# Patient Record
Sex: Female | Born: 1950 | Hispanic: No | Marital: Married | Smoking: Never smoker
Health system: Southern US, Community
[De-identification: ages and names within clinical notes are randomized; demographics above are authoritative.]

## PROBLEM LIST (undated history)

## (undated) DIAGNOSIS — E039 Hypothyroidism, unspecified: Secondary | ICD-10-CM

## (undated) DIAGNOSIS — E7800 Pure hypercholesterolemia, unspecified: Secondary | ICD-10-CM

## (undated) DIAGNOSIS — T7840XA Allergy, unspecified, initial encounter: Secondary | ICD-10-CM

## (undated) DIAGNOSIS — G250 Essential tremor: Secondary | ICD-10-CM

## (undated) DIAGNOSIS — M1990 Unspecified osteoarthritis, unspecified site: Secondary | ICD-10-CM

## (undated) DIAGNOSIS — F32A Depression, unspecified: Secondary | ICD-10-CM

## (undated) DIAGNOSIS — F329 Major depressive disorder, single episode, unspecified: Secondary | ICD-10-CM

## (undated) DIAGNOSIS — J45909 Unspecified asthma, uncomplicated: Secondary | ICD-10-CM

## (undated) DIAGNOSIS — M5430 Sciatica, unspecified side: Secondary | ICD-10-CM

## (undated) DIAGNOSIS — R7303 Prediabetes: Secondary | ICD-10-CM

## (undated) DIAGNOSIS — Z86718 Personal history of other venous thrombosis and embolism: Secondary | ICD-10-CM

## (undated) DIAGNOSIS — F419 Anxiety disorder, unspecified: Secondary | ICD-10-CM

## (undated) DIAGNOSIS — L509 Urticaria, unspecified: Secondary | ICD-10-CM

## (undated) DIAGNOSIS — E079 Disorder of thyroid, unspecified: Secondary | ICD-10-CM

## (undated) HISTORY — PX: BREAST CYST ASPIRATION: SHX578

## (undated) HISTORY — DX: Pure hypercholesterolemia, unspecified: E78.00

## (undated) HISTORY — DX: Allergy, unspecified, initial encounter: T78.40XA

## (undated) HISTORY — DX: Depression, unspecified: F32.A

## (undated) HISTORY — PX: THYROIDECTOMY: SHX17

## (undated) HISTORY — PX: BREAST BIOPSY: SHX20

## (undated) HISTORY — DX: Prediabetes: R73.03

## (undated) HISTORY — DX: Urticaria, unspecified: L50.9

## (undated) HISTORY — PX: EYE SURGERY: SHX253

## (undated) HISTORY — DX: Hypothyroidism, unspecified: E03.9

## (undated) HISTORY — DX: Sciatica, unspecified side: M54.30

## (undated) HISTORY — DX: Unspecified osteoarthritis, unspecified site: M19.90

## (undated) HISTORY — DX: Essential tremor: G25.0

## (undated) HISTORY — DX: Disorder of thyroid, unspecified: E07.9

## (undated) HISTORY — DX: Personal history of other venous thrombosis and embolism: Z86.718

## (undated) HISTORY — DX: Unspecified asthma, uncomplicated: J45.909

## (undated) HISTORY — DX: Major depressive disorder, single episode, unspecified: F32.9

## (undated) HISTORY — DX: Anxiety disorder, unspecified: F41.9

## (undated) HISTORY — PX: BUNIONECTOMY: SHX129

---

## 2010-02-03 ENCOUNTER — Encounter
Admission: RE | Admit: 2010-02-03 | Discharge: 2010-02-03 | Payer: Self-pay | Source: Home / Self Care | Attending: Family Medicine | Admitting: Family Medicine

## 2012-02-18 ENCOUNTER — Other Ambulatory Visit: Payer: Self-pay | Admitting: Family Medicine

## 2012-02-18 DIAGNOSIS — Z1231 Encounter for screening mammogram for malignant neoplasm of breast: Secondary | ICD-10-CM

## 2012-03-08 ENCOUNTER — Ambulatory Visit (INDEPENDENT_AMBULATORY_CARE_PROVIDER_SITE_OTHER): Payer: BC Managed Care – PPO | Admitting: Emergency Medicine

## 2012-03-08 VITALS — BP 117/76 | HR 90 | Temp 97.7°F | Resp 16 | Ht 66.5 in | Wt 207.0 lb

## 2012-03-08 DIAGNOSIS — J209 Acute bronchitis, unspecified: Secondary | ICD-10-CM

## 2012-03-08 MED ORDER — AZITHROMYCIN 250 MG PO TABS: ORAL_TABLET | ORAL | Status: DC

## 2012-03-08 MED ORDER — HYDROCOD POLST-CHLORPHEN POLST 10-8 MG/5ML PO LQCR: 5.0000 mL | Freq: Two times a day (BID) | ORAL | Status: DC | PRN

## 2012-03-08 NOTE — Patient Instructions (Signed)

## 2012-03-08 NOTE — Progress Notes (Signed)
[facility] [address], [place] [address] [phone]  Date:  03/08/2012   Name:  Julia Gomez   DOB:  [birth year]   MRN:  [id number]  PCP:  No primary provider on file.    Chief Complaint: Cough and Sinus Problem   History of Present Illness:  Julia Gomez is a 62 y.o. very pleasant female patient who presents with the following:  Ill since Saturday with fever and cough.  Temp is low grade.  Cough is productive purulent sputum.  No wheezing or shortness of breath.  Has nasal congestion and discharge with a post nasal drainage.  Mucoid in character.  No nausea or vomiting.  Sore throat.  No improvement with OTC medication.  There is no problem list on file for this patient.   Past Medical History  Diagnosis Date  . Asthma   . Allergy   . Depression   . Thyroid disease     Past Surgical History  Procedure Laterality Date  . Eye surgery      History  Substance Use Topics  . Smoking status: Never Smoker   . Smokeless tobacco: Not on file  . Alcohol Use: 0.0 oz/week    0 drink(s) per week    Family History  Problem Relation Age of Onset  . Hyperlipidemia Mother   . Diverticulitis Mother   . Hypothyroidism Mother   . Aneurysm Father   . COPD Father   . Arthritis Maternal Grandmother   . Heart disease Maternal Grandfather   . Heart disease Paternal Grandfather     Allergies  Allergen Reactions  . Codeine Other (See Comments)    numbness    Medication list has been reviewed and updated.  No current outpatient prescriptions on file prior to visit.   No current facility-administered medications on file prior to visit.    Review of Systems:  As per HPI, otherwise negative.    Physical Examination: Filed Vitals:   03/08/12 1523  BP: 117/76  Pulse: 90  Temp: 97.7 F (36.5 C)  Resp: 16   Filed Vitals:   03/08/12 1523  Height: 5' 6.5" (1.689 m)  Weight: 207 lb (93.895 kg)   Body mass index is 32.91 kg/(m^2). Ideal  Body Weight: Weight in (lb) to have BMI = 25: 156.9  GEN: WDWN, NAD, Non-toxic, A & O x 3 HEENT: Atraumatic, Normocephalic. Neck supple. No masses, No LAD. Ears and Nose: No external deformity. CV: RRR, No M/G/R. No JVD. No thrill. No extra heart sounds. PULM: CTA B, no wheezes, crackles, rhonchi. No retractions. No resp. distress. No accessory muscle use. ABD: S, NT, ND, +BS. No rebound. No HSM. EXTR: No c/c/e NEURO Normal gait.  PSYCH: Normally interactive. Conversant. Not depressed or anxious appearing.  Calm demeanor.    Assessment and Plan: Bronchitis zithromax tussionex  Carmelina Dane, MD

## 2012-03-25 ENCOUNTER — Ambulatory Visit: Payer: Self-pay

## 2012-04-18 ENCOUNTER — Other Ambulatory Visit: Payer: Self-pay

## 2012-04-18 DIAGNOSIS — Z1231 Encounter for screening mammogram for malignant neoplasm of breast: Secondary | ICD-10-CM

## 2012-05-06 ENCOUNTER — Ambulatory Visit
Admission: RE | Admit: 2012-05-06 | Discharge: 2012-05-06 | Disposition: A | Discharge status: Home / Self Care | Payer: BC Managed Care – PPO | Source: Ambulatory Visit

## 2012-05-06 DIAGNOSIS — Z1231 Encounter for screening mammogram for malignant neoplasm of breast: Secondary | ICD-10-CM

## 2013-09-12 ENCOUNTER — Other Ambulatory Visit: Payer: Self-pay

## 2013-09-12 DIAGNOSIS — Z1231 Encounter for screening mammogram for malignant neoplasm of breast: Secondary | ICD-10-CM

## 2013-09-22 ENCOUNTER — Ambulatory Visit
Admission: RE | Admit: 2013-09-22 | Discharge: 2013-09-22 | Disposition: A | Discharge status: Home / Self Care | Payer: BC Managed Care – PPO | Source: Ambulatory Visit

## 2013-09-22 DIAGNOSIS — Z1231 Encounter for screening mammogram for malignant neoplasm of breast: Secondary | ICD-10-CM

## 2016-04-20 ENCOUNTER — Other Ambulatory Visit: Payer: Self-pay | Admitting: Family Medicine

## 2016-04-20 DIAGNOSIS — Z1231 Encounter for screening mammogram for malignant neoplasm of breast: Secondary | ICD-10-CM

## 2016-05-06 ENCOUNTER — Ambulatory Visit
Admission: RE | Admit: 2016-05-06 | Discharge: 2016-05-06 | Disposition: A | Discharge status: Home / Self Care | Payer: BLUE CROSS/BLUE SHIELD | Source: Ambulatory Visit | Attending: Family Medicine | Admitting: Family Medicine

## 2016-05-06 DIAGNOSIS — Z1231 Encounter for screening mammogram for malignant neoplasm of breast: Secondary | ICD-10-CM

## 2017-05-06 ENCOUNTER — Other Ambulatory Visit: Payer: Self-pay | Admitting: Family Medicine

## 2017-05-06 DIAGNOSIS — Z139 Encounter for screening, unspecified: Secondary | ICD-10-CM

## 2017-05-27 ENCOUNTER — Ambulatory Visit: Payer: BLUE CROSS/BLUE SHIELD

## 2017-06-08 ENCOUNTER — Encounter: Payer: Self-pay | Admitting: *Deleted

## 2017-07-26 ENCOUNTER — Ambulatory Visit
Admission: RE | Admit: 2017-07-26 | Discharge: 2017-07-26 | Disposition: A | Discharge status: Home / Self Care | Payer: Medicare HMO | Source: Ambulatory Visit | Attending: Family Medicine | Admitting: Family Medicine

## 2017-07-26 DIAGNOSIS — Z139 Encounter for screening, unspecified: Secondary | ICD-10-CM

## 2018-01-24 ENCOUNTER — Ambulatory Visit: Payer: Self-pay

## 2019-02-10 ENCOUNTER — Ambulatory Visit: Payer: Medicare HMO | Attending: Internal Medicine

## 2019-02-10 DIAGNOSIS — Z23 Encounter for immunization: Secondary | ICD-10-CM | POA: Insufficient documentation

## 2019-02-10 NOTE — Progress Notes (Signed)
   Covid-19 Vaccination Clinic  Name:  Julia Gomez    MRN: [id number] DOB: 07/20/1950  02/10/2019  [name] was observed post Covid-19 immunization for 15 minutes without incidence. She was provided with Vaccine Information Sheet and instruction to access the V-Safe system.   [name] was instructed to call 911 with any severe reactions post vaccine: Marland Kitchen Difficulty breathing  . Swelling of your face and throat  . A fast heartbeat  . A bad rash all over your body  . Dizziness and weakness    Immunizations Administered    Name Date Dose VIS Date Route   Pfizer COVID-19 Vaccine 02/10/2019  4:40 PM 0.3 mL 12/30/2018 Intramuscular   Manufacturer: [employer], [employer]   Lot: [id number]   NDC: [address]-0

## 2019-03-03 ENCOUNTER — Ambulatory Visit: Payer: Medicare HMO | Attending: Internal Medicine

## 2019-03-03 DIAGNOSIS — Z23 Encounter for immunization: Secondary | ICD-10-CM | POA: Insufficient documentation

## 2019-03-03 NOTE — Progress Notes (Signed)
   Covid-19 Vaccination Clinic  Name:  Julia Gomez    MRN: [id number] DOB: 05-Oct-1950  03/03/2019  [name] was observed post Covid-19 immunization for 15 minutes without incidence. She was provided with Vaccine Information Sheet and instruction to access the V-Safe system.   [name] was instructed to call 911 with any severe reactions post vaccine: Marland Kitchen Difficulty breathing  . Swelling of your face and throat  . A fast heartbeat  . A bad rash all over your body  . Dizziness and weakness    Immunizations Administered    Name Date Dose VIS Date Route   Pfizer COVID-19 Vaccine 03/03/2019  4:17 PM 0.3 mL 12/30/2018 Intramuscular   Manufacturer: [employer], [employer]   Lot: [id number]   NDC: [address]-4

## 2019-09-12 ENCOUNTER — Emergency Department (HOSPITAL_COMMUNITY): Payer: Medicare HMO

## 2019-09-12 ENCOUNTER — Other Ambulatory Visit: Payer: Self-pay

## 2019-09-12 ENCOUNTER — Inpatient Hospital Stay (HOSPITAL_COMMUNITY): Payer: Medicare HMO

## 2019-09-12 ENCOUNTER — Encounter (HOSPITAL_COMMUNITY): Payer: Self-pay

## 2019-09-12 ENCOUNTER — Inpatient Hospital Stay (HOSPITAL_COMMUNITY)
Admission: EM | Admit: 2019-09-12 | Discharge: 2019-09-19 | DRG: 166 | Disposition: A | Discharge status: Home Health | Payer: Medicare HMO | Attending: Internal Medicine | Admitting: Internal Medicine

## 2019-09-12 DIAGNOSIS — I2602 Saddle embolus of pulmonary artery with acute cor pulmonale: Secondary | ICD-10-CM | POA: Diagnosis not present

## 2019-09-12 DIAGNOSIS — R519 Headache, unspecified: Secondary | ICD-10-CM | POA: Diagnosis not present

## 2019-09-12 DIAGNOSIS — R32 Unspecified urinary incontinence: Secondary | ICD-10-CM | POA: Diagnosis present

## 2019-09-12 DIAGNOSIS — M7501 Adhesive capsulitis of right shoulder: Secondary | ICD-10-CM | POA: Diagnosis present

## 2019-09-12 DIAGNOSIS — R197 Diarrhea, unspecified: Secondary | ICD-10-CM | POA: Diagnosis present

## 2019-09-12 DIAGNOSIS — Z20822 Contact with and (suspected) exposure to covid-19: Secondary | ICD-10-CM | POA: Diagnosis present

## 2019-09-12 DIAGNOSIS — I82412 Acute embolism and thrombosis of left femoral vein: Secondary | ICD-10-CM | POA: Diagnosis not present

## 2019-09-12 DIAGNOSIS — Y92019 Unspecified place in single-family (private) house as the place of occurrence of the external cause: Secondary | ICD-10-CM

## 2019-09-12 DIAGNOSIS — I110 Hypertensive heart disease with heart failure: Secondary | ICD-10-CM | POA: Diagnosis present

## 2019-09-12 DIAGNOSIS — Z8261 Family history of arthritis: Secondary | ICD-10-CM

## 2019-09-12 DIAGNOSIS — Z885 Allergy status to narcotic agent status: Secondary | ICD-10-CM

## 2019-09-12 DIAGNOSIS — Y838 Other surgical procedures as the cause of abnormal reaction of the patient, or of later complication, without mention of misadventure at the time of the procedure: Secondary | ICD-10-CM | POA: Diagnosis not present

## 2019-09-12 DIAGNOSIS — Z6836 Body mass index (BMI) 36.0-36.9, adult: Secondary | ICD-10-CM | POA: Diagnosis not present

## 2019-09-12 DIAGNOSIS — I2721 Secondary pulmonary arterial hypertension: Secondary | ICD-10-CM | POA: Diagnosis present

## 2019-09-12 DIAGNOSIS — J9601 Acute respiratory failure with hypoxia: Secondary | ICD-10-CM | POA: Diagnosis present

## 2019-09-12 DIAGNOSIS — M542 Cervicalgia: Secondary | ICD-10-CM | POA: Diagnosis present

## 2019-09-12 DIAGNOSIS — Z79899 Other long term (current) drug therapy: Secondary | ICD-10-CM

## 2019-09-12 DIAGNOSIS — E785 Hyperlipidemia, unspecified: Secondary | ICD-10-CM | POA: Diagnosis present

## 2019-09-12 DIAGNOSIS — W010XXA Fall on same level from slipping, tripping and stumbling without subsequent striking against object, initial encounter: Secondary | ICD-10-CM | POA: Diagnosis present

## 2019-09-12 DIAGNOSIS — D62 Acute posthemorrhagic anemia: Secondary | ICD-10-CM | POA: Diagnosis not present

## 2019-09-12 DIAGNOSIS — E669 Obesity, unspecified: Secondary | ICD-10-CM | POA: Diagnosis present

## 2019-09-12 DIAGNOSIS — Z8249 Family history of ischemic heart disease and other diseases of the circulatory system: Secondary | ICD-10-CM

## 2019-09-12 DIAGNOSIS — Z87820 Personal history of traumatic brain injury: Secondary | ICD-10-CM

## 2019-09-12 DIAGNOSIS — Z83438 Family history of other disorder of lipoprotein metabolism and other lipidemia: Secondary | ICD-10-CM

## 2019-09-12 DIAGNOSIS — N179 Acute kidney failure, unspecified: Secondary | ICD-10-CM | POA: Diagnosis present

## 2019-09-12 DIAGNOSIS — I9762 Postprocedural hemorrhage of a circulatory system organ or structure following other procedure: Secondary | ICD-10-CM | POA: Diagnosis not present

## 2019-09-12 DIAGNOSIS — J45909 Unspecified asthma, uncomplicated: Secondary | ICD-10-CM | POA: Diagnosis present

## 2019-09-12 DIAGNOSIS — R7303 Prediabetes: Secondary | ICD-10-CM | POA: Diagnosis present

## 2019-09-12 DIAGNOSIS — M19011 Primary osteoarthritis, right shoulder: Secondary | ICD-10-CM | POA: Diagnosis present

## 2019-09-12 DIAGNOSIS — I82452 Acute embolism and thrombosis of left peroneal vein: Secondary | ICD-10-CM | POA: Diagnosis present

## 2019-09-12 DIAGNOSIS — S0003XA Contusion of scalp, initial encounter: Secondary | ICD-10-CM | POA: Diagnosis present

## 2019-09-12 DIAGNOSIS — I2699 Other pulmonary embolism without acute cor pulmonale: Secondary | ICD-10-CM | POA: Diagnosis present

## 2019-09-12 DIAGNOSIS — I50810 Right heart failure, unspecified: Secondary | ICD-10-CM | POA: Diagnosis present

## 2019-09-12 DIAGNOSIS — R52 Pain, unspecified: Secondary | ICD-10-CM | POA: Diagnosis not present

## 2019-09-12 DIAGNOSIS — F329 Major depressive disorder, single episode, unspecified: Secondary | ICD-10-CM | POA: Diagnosis present

## 2019-09-12 DIAGNOSIS — R34 Anuria and oliguria: Secondary | ICD-10-CM | POA: Diagnosis not present

## 2019-09-12 DIAGNOSIS — Z825 Family history of asthma and other chronic lower respiratory diseases: Secondary | ICD-10-CM

## 2019-09-12 DIAGNOSIS — G250 Essential tremor: Secondary | ICD-10-CM | POA: Diagnosis present

## 2019-09-12 DIAGNOSIS — Z86711 Personal history of pulmonary embolism: Secondary | ICD-10-CM | POA: Diagnosis not present

## 2019-09-12 DIAGNOSIS — I2692 Saddle embolus of pulmonary artery without acute cor pulmonale: Secondary | ICD-10-CM | POA: Diagnosis present

## 2019-09-12 DIAGNOSIS — I82432 Acute embolism and thrombosis of left popliteal vein: Secondary | ICD-10-CM | POA: Diagnosis present

## 2019-09-12 DIAGNOSIS — I82442 Acute embolism and thrombosis of left tibial vein: Secondary | ICD-10-CM | POA: Diagnosis present

## 2019-09-12 DIAGNOSIS — Z8349 Family history of other endocrine, nutritional and metabolic diseases: Secondary | ICD-10-CM

## 2019-09-12 HISTORY — PX: IR INFUSION THROMBOL ARTERIAL INITIAL (MS): IMG5376

## 2019-09-12 HISTORY — PX: IR ANGIOGRAM PULMONARY BILATERAL SELECTIVE: IMG664

## 2019-09-12 HISTORY — PX: IR US GUIDE VASC ACCESS RIGHT: IMG2390

## 2019-09-12 HISTORY — PX: IR ANGIOGRAM SELECTIVE EACH ADDITIONAL VESSEL: IMG667

## 2019-09-12 LAB — CBC
HCT: 43 % (ref 36.0–46.0)
HCT: 38.5 % (ref 36.0–46.0)
Hemoglobin: 13.3 g/dL (ref 12.0–15.0)
Hemoglobin: 12.1 g/dL (ref 12.0–15.0)
MCH: 29.8 pg (ref 26.0–34.0)
MCH: 29.4 pg (ref 26.0–34.0)
MCHC: 30.9 g/dL (ref 30.0–36.0)
MCHC: 31.4 g/dL (ref 30.0–36.0)
MCV: 96.2 fL (ref 80.0–100.0)
MCV: 93.4 fL (ref 80.0–100.0)
Platelets: 209 10*3/uL (ref 150–400)
Platelets: 144 10*3/uL — ABNORMAL LOW (ref 150–400)
RBC: 4.47 MIL/uL (ref 3.87–5.11)
RBC: 4.12 MIL/uL (ref 3.87–5.11)
RDW: 15.5 % (ref 11.5–15.5)
RDW: 15.4 % (ref 11.5–15.5)
WBC: 10.1 10*3/uL (ref 4.0–10.5)
WBC: 9.6 10*3/uL (ref 4.0–10.5)
nRBC: 0 % (ref 0.0–0.2)
nRBC: 0 % (ref 0.0–0.2)

## 2019-09-12 LAB — FERRITIN: Ferritin: 270 ng/mL (ref 11–307)

## 2019-09-12 LAB — FIBRINOGEN
Fibrinogen: 338 mg/dL (ref 210–475)
Fibrinogen: 663 mg/dL — ABNORMAL HIGH (ref 210–475)

## 2019-09-12 LAB — HEMOGLOBIN A1C
Hgb A1c MFr Bld: 6.1 % — ABNORMAL HIGH (ref 4.8–5.6)
Mean Plasma Glucose: 128.37 mg/dL

## 2019-09-12 LAB — BASIC METABOLIC PANEL
Anion gap: 11 (ref 5–15)
BUN: 15 mg/dL (ref 8–23)
CO2: 22 mmol/L (ref 22–32)
Calcium: 8.1 mg/dL — ABNORMAL LOW (ref 8.9–10.3)
Chloride: 108 mmol/L (ref 98–111)
Creatinine, Ser: 1.34 mg/dL — ABNORMAL HIGH (ref 0.44–1.00)
GFR calc Af Amer: 47 mL/min — ABNORMAL LOW (ref >60)
GFR calc non Af Amer: 41 mL/min — ABNORMAL LOW (ref >60)
Glucose, Bld: 147 mg/dL — ABNORMAL HIGH (ref 70–99)
Potassium: 4.4 mmol/L (ref 3.5–5.1)
Sodium: 141 mmol/L (ref 135–145)

## 2019-09-12 LAB — PROTIME-INR
INR: 1.1 (ref 0.8–1.2)
Prothrombin Time: 13.7 seconds (ref 11.4–15.2)

## 2019-09-12 LAB — GLUCOSE, CAPILLARY: Glucose-Capillary: 97 mg/dL (ref 70–99)

## 2019-09-12 LAB — C-REACTIVE PROTEIN: CRP: 9.9 mg/dL — ABNORMAL HIGH (ref <1.0)

## 2019-09-12 LAB — D-DIMER, QUANTITATIVE: D-Dimer, Quant: 14.2 ug/mL-FEU — ABNORMAL HIGH (ref 0.00–0.50)

## 2019-09-12 LAB — TRIGLYCERIDES: Triglycerides: 112 mg/dL (ref <150)

## 2019-09-12 LAB — LACTIC ACID, PLASMA: Lactic Acid, Venous: 1.8 mmol/L (ref 0.5–1.9)

## 2019-09-12 LAB — SARS CORONAVIRUS 2 BY RT PCR (HOSPITAL ORDER, PERFORMED IN ~~LOC~~ HOSPITAL LAB): SARS Coronavirus 2: NEGATIVE

## 2019-09-12 LAB — APTT: aPTT: 27 seconds (ref 24–36)

## 2019-09-12 LAB — LACTATE DEHYDROGENASE: LDH: 376 U/L — ABNORMAL HIGH (ref 98–192)

## 2019-09-12 LAB — PROCALCITONIN: Procalcitonin: <0.1 ng/mL

## 2019-09-12 MED ORDER — MIDAZOLAM HCL 2 MG/2ML IJ SOLN
INTRAMUSCULAR | Status: AC
  Filled 2019-09-12: qty 2

## 2019-09-12 MED ORDER — LIDOCAINE HCL 1 % IJ SOLN
INTRAMUSCULAR | Status: AC
  Filled 2019-09-12: qty 20

## 2019-09-12 MED ORDER — DOCUSATE SODIUM 100 MG PO CAPS
100.0000 mg | ORAL_CAPSULE | Freq: Every day | ORAL | Status: DC
  Administered 2019-09-15: 100 mg via ORAL
  Filled 2019-09-12: qty 1

## 2019-09-12 MED ORDER — CHLORHEXIDINE GLUCONATE CLOTH 2 % EX PADS
6.0000 | MEDICATED_PAD | Freq: Every day | CUTANEOUS | Status: DC
  Administered 2019-09-12: 6 via TOPICAL

## 2019-09-12 MED ORDER — IOHEXOL 300 MG/ML  SOLN
100.0000 mL | Freq: Once | INTRAMUSCULAR | Status: AC | PRN
  Administered 2019-09-12: 25 mL via INTRAVENOUS

## 2019-09-12 MED ORDER — POLYETHYLENE GLYCOL 3350 17 G PO PACK: 17.0000 g | PACK | Freq: Every day | ORAL | Status: DC | PRN

## 2019-09-12 MED ORDER — FENTANYL CITRATE (PF) 100 MCG/2ML IJ SOLN
INTRAMUSCULAR | Status: AC
  Filled 2019-09-12: qty 2

## 2019-09-12 MED ORDER — ONDANSETRON HCL 4 MG/2ML IJ SOLN: 4.0000 mg | Freq: Four times a day (QID) | INTRAMUSCULAR | Status: DC | PRN

## 2019-09-12 MED ORDER — POLYETHYLENE GLYCOL 3350 17 G PO PACK
17.0000 g | PACK | Freq: Every day | ORAL | Status: DC
  Administered 2019-09-15 – 2019-09-18 (×3): 17 g via ORAL
  Filled 2019-09-12 (×5): qty 1

## 2019-09-12 MED ORDER — FENTANYL CITRATE (PF) 100 MCG/2ML IJ SOLN
INTRAMUSCULAR | Status: AC | PRN
  Administered 2019-09-12 (×2): 50 ug via INTRAVENOUS

## 2019-09-12 MED ORDER — SODIUM CHLORIDE 0.9 % IV SOLN: INTRAVENOUS | Status: DC

## 2019-09-12 MED ORDER — HEPARIN BOLUS VIA INFUSION
5500.0000 [IU] | Freq: Once | INTRAVENOUS | Status: AC
  Administered 2019-09-12: 5500 [IU] via INTRAVENOUS
  Filled 2019-09-12: qty 5500

## 2019-09-12 MED ORDER — MIDAZOLAM HCL 2 MG/2ML IJ SOLN
INTRAMUSCULAR | Status: AC | PRN
  Administered 2019-09-12 (×2): 0.5 mg via INTRAVENOUS

## 2019-09-12 MED ORDER — SODIUM CHLORIDE 0.9 % IV SOLN: 250.0000 mL | INTRAVENOUS | Status: DC | PRN

## 2019-09-12 MED ORDER — SODIUM CHLORIDE 0.9% FLUSH
3.0000 mL | Freq: Two times a day (BID) | INTRAVENOUS | Status: DC
  Administered 2019-09-13 – 2019-09-18 (×7): 3 mL via INTRAVENOUS

## 2019-09-12 MED ORDER — SODIUM CHLORIDE 0.9 % IV BOLUS
500.0000 mL | Freq: Once | INTRAVENOUS | Status: AC
  Administered 2019-09-12: 500 mL via INTRAVENOUS

## 2019-09-12 MED ORDER — DOCUSATE SODIUM 100 MG PO CAPS: 100.0000 mg | ORAL_CAPSULE | Freq: Two times a day (BID) | ORAL | Status: DC | PRN

## 2019-09-12 MED ORDER — HEPARIN (PORCINE) 25000 UT/250ML-% IV SOLN
1100.0000 [IU]/h | INTRAVENOUS | Status: DC
  Administered 2019-09-12: 1400 [IU]/h via INTRAVENOUS
  Administered 2019-09-13: 1200 [IU]/h via INTRAVENOUS
  Administered 2019-09-14: 1000 [IU]/h via INTRAVENOUS
  Administered 2019-09-16: 1150 [IU]/h via INTRAVENOUS
  Filled 2019-09-12 (×5): qty 250

## 2019-09-12 MED ORDER — IOHEXOL 350 MG/ML SOLN
60.0000 mL | Freq: Once | INTRAVENOUS | Status: AC | PRN
  Administered 2019-09-12: 60 mL via INTRAVENOUS

## 2019-09-12 MED ORDER — SODIUM CHLORIDE 0.9 % IV SOLN
12.0000 mg | Freq: Once | INTRAVENOUS | Status: AC
  Administered 2019-09-12: 12 mg via INTRAVENOUS
  Filled 2019-09-12: qty 12

## 2019-09-12 MED ORDER — SODIUM CHLORIDE 0.9% FLUSH: 3.0000 mL | INTRAVENOUS | Status: DC | PRN

## 2019-09-12 MED ORDER — ORAL CARE MOUTH RINSE
15.0000 mL | Freq: Two times a day (BID) | OROMUCOSAL | Status: DC
  Administered 2019-09-13 – 2019-09-18 (×8): 15 mL via OROMUCOSAL

## 2019-09-12 MED ORDER — LIDOCAINE HCL (PF) 1 % IJ SOLN
INTRAMUSCULAR | Status: AC | PRN
  Administered 2019-09-12 (×2): 5 mL

## 2019-09-12 MED ORDER — ACETAMINOPHEN 325 MG PO TABS
650.0000 mg | ORAL_TABLET | Freq: Once | ORAL | Status: AC
  Administered 2019-09-13: 650 mg via ORAL
  Filled 2019-09-12 (×2): qty 2

## 2019-09-12 NOTE — Procedures (Signed)
Pre procedural Dx: Submassive PE Post procedural Dx: Same  Successful initiation of bilateral catheter directed pulmonary arterial thrombolysis.  Main PA pressure: 61/32 (mean - 44)  Access: R CFV x2 Keep right leg straight while sheaths remain in place.     EBL: Trace Complications: None immediate  PLAN: AM CXR and bedside pressure measurement and catheter removal tomorrow after 12 hrs of lytic infusion.   Katherina Right, MD Pager #: [phone]

## 2019-09-12 NOTE — ED Triage Notes (Signed)
Pt BIB [facility] for eval of fall x 1 week ago. Since that time pt has developed increased weakness, dizziness, SOB, general malaise. EMS also reports head/neck pain, no thinners. Pt reports she tripped and fell, unsure of LOC>

## 2019-09-12 NOTE — Progress Notes (Signed)
ANTICOAGULATION CONSULT NOTE - Initial Consult  Pharmacy Consult for heparin Indication: pulmonary embolus  Allergies  Allergen Reactions   Codeine Other (See Comments)    numbness    Patient Measurements: Height: 5' 6.5" (168.9 cm) Weight: 94 kg (207 lb 3.7 oz) IBW/kg (Calculated) : 60.45 Heparin Dosing Weight: 81.1 kg  Vital Signs: Temp: 100.7 F (38.2 C) (08/24 1335) Temp Source: Rectal (08/24 1335) BP: 99/85 (08/24 1600) Pulse Rate: 97 (08/24 1600)  Labs: Recent Labs    09/12/19 1256  HGB 13.3  HCT 43.0  PLT 209  CREATININE 1.34*    Estimated Creatinine Clearance: 47.5 mL/min (A) (by C-G formula based on SCr of 1.34 mg/dL (H)).   Medical History: Past Medical History:  Diagnosis Date   Allergy    Asthma    Depression    Thyroid disease     Medications:  Scheduled:   acetaminophen  650 mg Oral Once   heparin  5,500 Units Intravenous Once   Infusions:   heparin      Assessment: 69 yo female presents s/p mechanical fall on 09/07/19 due to fatigue and shortness of breath. The patient did hit their head but did not lose consciousness. Upon arrival, the patient was hypoxic at 78% O2 on room air. D-dimer 14.20, CBC WNL, LDH 376 and CRP 9.9. The patient is COVID negative. Pharmacy is consulted to dose heparin.  The patient is not on any anticoagulation PTA. CT is positive for pulmonary embolism. The patient has a CBC WNL and there are no signs or symptoms of bleeding documented.   Goal of Therapy:  Heparin level 0.3-0.7 units/ml Monitor platelets by anticoagulation protocol: Yes   Plan:  Heparin IV 5500 unit bolus x1 dose Heparin IV infusion at 1400 units/hour Obtain 6-hour heparin level Monitor daily heparin level and CBC Monitor for signs and symptoms of bleeding  Sanda Klein, PharmD, RPh  PGY-1 Pharmacy Resident 09/12/2019 4:56 PM  Please check [email or web address] for unit-specific pharmacy phone numbers.

## 2019-09-12 NOTE — ED Provider Notes (Signed)
[facility] EMERGENCY DEPARTMENT Provider Note   CSN: [id number] Arrival date & time: 09/12/19  1251     History Chief Complaint  Patient presents with  . Fall  . Dizziness    Julia Gomez is a 69 y.o. female with PMHx HTN, depression, who presents to the ED via EMS for mechanical fall that occurred 6 days ago on 08/19. Pt reports she believes she tripped and fell, hitting her head. No LOC. She states she saw her PCP at that time and was diagnosed with a concussion. She states that since that day (prior to the fall) she has been very fatigued and felt short of breath. Pt noted to be hypoxic on arrival with O2 sats at 78% on RA. Pt is not normally on oxygen at home. She denies any recent sick contacts. She has been vaccinated against COVID 19. Denies fevers, chills, cough, chest pain, abdominal pain, nausea, vomiting, diarrhea, or any other associated symptoms. Pt does report she has had a headache and neck pain since her fall as well as dizziness. No blurry vision or double vision.   The history is provided by the patient, medical records and the EMS personnel.       Past Medical History:  Diagnosis Date  . Allergy   . Asthma   . Depression   . Thyroid disease     There are no problems to display for this patient.   Past Surgical History:  Procedure Laterality Date  . BREAST BIOPSY Right    benign  . BREAST CYST ASPIRATION Left    benign  . EYE SURGERY       OB History   No obstetric history on file.     Family History  Problem Relation Age of Onset  . Hyperlipidemia Mother   . Diverticulitis Mother   . Hypothyroidism Mother   . Aneurysm Father   . COPD Father   . Arthritis Maternal Grandmother   . Heart disease Maternal Grandfather   . Heart disease Paternal Grandfather     Social History   Tobacco Use  . Smoking status: Never Smoker  Substance Use Topics  . Alcohol use: Yes    Alcohol/week: 0.0 standard drinks  . Drug use: No     Home Medications Prior to Admission medications   Medication Sig Start Date End Date Taking? Authorizing Provider  azithromycin (ZITHROMAX) 250 MG tablet Take 2 tabs PO x 1 dose, then 1 tab PO QD x 4 days 03/08/12   Carmelina Dane, MD  Calcium Carbonate-Vit D-Min (CALCIUM 1200 PO) Take 1 tablet by mouth daily.    [provider]  chlorpheniramine-HYDROcodone (TUSSIONEX PENNKINETIC ER) 10-8 MG/5ML LQCR Take 5 mLs by mouth every 12 (twelve) hours as needed (cough). 03/08/12   Carmelina Dane, MD  Cholecalciferol (DIALYVITE VITAMIN D 5000 PO) Take 1 tablet by mouth daily.    [provider]  fish oil-omega-3 fatty acids 1000 MG capsule Take 2 g by mouth daily.    [provider]  FLUoxetine (PROZAC) 20 MG tablet Take 20 mg by mouth 2 (two) times daily.    [provider]  simvastatin (ZOCOR) 20 MG tablet Take 20 mg by mouth every evening.    [provider]    Allergies    Codeine  Review of Systems   Review of Systems  Constitutional: Positive for fatigue. Negative for chills and fever.  Respiratory: Positive for shortness of breath. Negative for cough.   Cardiovascular:  Negative for chest pain.  Gastrointestinal: Negative for abdominal pain, diarrhea, nausea and vomiting.  Neurological: Positive for dizziness and headaches.  All other systems reviewed and are negative.   Physical Exam Updated Vital Signs BP 111/76 (BP Location: Right Arm)   Pulse (!) 113   Temp (S) (!) 100.7 F (38.2 C) (Rectal)   Resp (!) 23   Ht 5' 6.5" (1.689 m)   Wt 94 kg   SpO2 (!) 87%   BMI 32.95 kg/m   Physical Exam Vitals and nursing note reviewed.  Constitutional:      Appearance: She is obese. She is not ill-appearing or diaphoretic.  HENT:     Head: Normocephalic and atraumatic.  Eyes:     Extraocular Movements: Extraocular movements intact.     Conjunctiva/sclera: Conjunctivae normal.     Pupils: Pupils are equal, round, and reactive  to light.  Neck:     Comments: Mild left paraspinal musculature TTP. No obvious midline spinal TTP.  Cardiovascular:     Rate and Rhythm: Normal rate and regular rhythm.  Pulmonary:     Effort: Pulmonary effort is normal.     Breath sounds: Normal breath sounds. No wheezing, rhonchi or rales.     Comments: Able to speak in short sentences. Satting 90% on 7 L O2.  Abdominal:     Palpations: Abdomen is soft.     Tenderness: There is no abdominal tenderness. There is no guarding or rebound.  Musculoskeletal:     Cervical back: Neck supple.     Comments: Bilateral non pitting edema  Skin:    General: Skin is warm and dry.  Neurological:     General: No focal deficit present.     Mental Status: She is alert and oriented to person, place, and time. Mental status is at baseline.     ED Results / Procedures / Treatments   Labs (all labs ordered are listed, but only abnormal results are displayed) Labs Reviewed  BASIC METABOLIC PANEL - Abnormal; Notable for the following components:      Result Value   Glucose, Bld 147 (*)    Creatinine, Ser 1.34 (*)    Calcium 8.1 (*)    GFR calc non Af Amer 41 (*)    GFR calc Af Amer 47 (*)    All other components within normal limits  D-DIMER, QUANTITATIVE (NOT AT [facility]) - Abnormal; Notable for the following components:   D-Dimer, Quant 14.20 (*)    All other components within normal limits  C-REACTIVE PROTEIN - Abnormal; Notable for the following components:   CRP 9.9 (*)    All other components within normal limits  LACTATE DEHYDROGENASE - Abnormal; Notable for the following components:   LDH 376 (*)    All other components within normal limits  SARS CORONAVIRUS 2 BY RT PCR (HOSPITAL ORDER, PERFORMED IN [facility])  CULTURE, BLOOD (ROUTINE X 2)  CULTURE, BLOOD (ROUTINE X 2)  CBC  LACTIC ACID, PLASMA  FERRITIN  FIBRINOGEN  TRIGLYCERIDES  URINALYSIS, ROUTINE W REFLEX MICROSCOPIC  LACTIC ACID, PLASMA  PROCALCITONIN  CBG  MONITORING, ED    EKG EKG Interpretation  Date/Time:  Tuesday September 12 2019 13:09:30 EDT Ventricular Rate:  115 PR Interval:  136 QRS Duration: 70 QT Interval:  318 QTC Calculation: 439 R Axis:   107 Text Interpretation: Sinus tachycardia Rightward axis Low voltage QRS Cannot rule out Anterior infarct , age undetermined Abnormal ECG Confirmed by Gerhard Munch [phone]) on 09/12/2019  3:01:01 PM   Radiology DG Chest Port 1 View  Result Date: 09/12/2019 CLINICAL DATA:  Shortness of breath. EXAM: PORTABLE CHEST 1 VIEW COMPARISON:  None. FINDINGS: The heart size and mediastinal contours are within normal limits. Both lungs are clear. No pneumothorax or pleural effusion is noted. The visualized skeletal structures are unremarkable. IMPRESSION: No active disease. Electronically Signed   By: Lupita Raider M.D.   On: 09/12/2019 14:43    Procedures Procedures (including critical care time)  Medications Ordered in ED Medications  acetaminophen (TYLENOL) tablet 650 mg (has no administration in time range)  sodium chloride 0.9 % bolus 500 mL (500 mLs Intravenous New Bag/Given 09/12/19 1412)    ED Course  I have reviewed the triage vital signs and the nursing notes.  Pertinent labs & imaging results that were available during my care of the patient were reviewed by me and considered in my medical decision making (see chart for details).  Clinical Course as of Sep 11 1536  Tue Sep 12, 2019  1329 SpO2(!): 78 % [MV]  1329 Pulse Rate(!): 118 [MV]  1336 rectal  TempMarland Kitchen)(S): 100.7 F (38.2 C) [MV]    Clinical Course User Index [MV] Tanda Rockers, PA-C   MDM Rules/Calculators/A&P                          69 year old female who presents to the ED via EMS for fall and dizziness.  Apparently she fell/had syncopal episode 6 days ago and saw her PCP and diagnosed with a concussion.  Patient states she believes she tripped and fell however than that time she states she thinks she may have  just passed out.  Of note on arrival to the ED patient is noted to be hypoxic at 78% on room air, not normally on O2 at home.  She is also noted to be tachycardic.  Rectal temperature of 100.7.  She has been vaccinated against COVID-19 however there is concern at this time.  Concern for Covid versus pulmonary infection, CXR ordered. Will plan to admit given hypoxia, patient has been placed on 7 L O2 with improvement in her oxygen saturation to 90%.  X-ray ordered.  If Covid is negative will need to rule out PE given tachycardia, hypoxia, low-grade fever.  Patient without any obvious risk factors for PE.   EKG with sinus tach  CXR clear CBC without leukocytosis, WBC at 10.1. Hgb stable at 13.3 Lactic acid 1.8. Does not meet SEPSIS criteria currently.  BMP with creatinine 1.34; pt with known hx of CKD per Care Everywhere with recent creatinine 1.13. 500 fluids given with tachycardia and mild elevation in creatinine.  COVID test has returned negative.  D dimer elevated at 14.20; CTA has been ordered at this time in addition with CT Head and CT C spine as pt complaining of a headache and neck pain since her fall/syncope  At shift change case signed out to Langston Masker, PA-C who will plan to admit after CTA returns for hypoxia/possible PE.   This note was prepared using Dragon voice recognition software and may include unintentional dictation errors due to the inherent limitations of voice recognition software.  Final Clinical Impression(s) / ED Diagnoses Final diagnoses:  None    Rx / DC Orders ED Discharge Orders    None       Tanda Rockers, PA-C 09/12/19 1538    Gerhard Munch, MD 09/16/19 [phone]

## 2019-09-12 NOTE — ED Notes (Signed)
Attempted to call pt's husband with update and room assignment, no answer.

## 2019-09-12 NOTE — Consult Note (Signed)
Chief Complaint: Submassive PE  Referring Physician(s): Bernette Mayers  Patient Status: [facility] - ED  History of Present Illness: Julia Gomez is a 69 y.o. female with no significant past medical history who presented to the emergency department today with progressive shortness of breath after experiencing a fall several days ago.  Subsequent chest CTA performed emergency department demonstrates large volume of bilateral pulmonary embolism.  Patient will be evaluated by the critical care service the an appropriate candidate for attempted catheter directed bilateral pulmonary arterial thrombolysis.  Patient is seen in the emergency department accompanied by her husband.  She continues complain of shortness of breath though states this has been improved since starting supplemental oxygen.  Patient denies history of cancer.  She denies recent surgery or significant trauma.  Past Medical History:  Diagnosis Date  . Allergy   . Asthma   . Depression   . Thyroid disease     Past Surgical History:  Procedure Laterality Date  . BREAST BIOPSY Right    benign  . BREAST CYST ASPIRATION Left    benign  . EYE SURGERY      Allergies: Codeine  Medications: Prior to Admission medications   Medication Sig Start Date End Date Taking? Authorizing Provider  azithromycin (ZITHROMAX) 250 MG tablet Take 2 tabs PO x 1 dose, then 1 tab PO QD x 4 days 03/08/12   Carmelina Dane, MD  Calcium Carbonate-Vit D-Min (CALCIUM 1200 PO) Take 1 tablet by mouth daily.    [provider]  chlorpheniramine-HYDROcodone (TUSSIONEX PENNKINETIC ER) 10-8 MG/5ML LQCR Take 5 mLs by mouth every 12 (twelve) hours as needed (cough). 03/08/12   Carmelina Dane, MD  Cholecalciferol (DIALYVITE VITAMIN D 5000 PO) Take 1 tablet by mouth daily.    [provider]  fish oil-omega-3 fatty acids 1000 MG capsule Take 2 g by mouth daily.    [provider]  FLUoxetine (PROZAC) 20 MG tablet Take 20 mg by  mouth 2 (two) times daily.    [provider]  simvastatin (ZOCOR) 20 MG tablet Take 20 mg by mouth every evening.    [provider]     Family History  Problem Relation Age of Onset  . Hyperlipidemia Mother   . Diverticulitis Mother   . Hypothyroidism Mother   . Aneurysm Father   . COPD Father   . Arthritis Maternal Grandmother   . Heart disease Maternal Grandfather   . Heart disease Paternal Grandfather     Social History   Socioeconomic History  . Marital status: Married    Spouse name: Not on file  . Number of children: Not on file  . Years of education: Not on file  . Highest education level: Not on file  Occupational History  . Not on file  Tobacco Use  . Smoking status: Never Smoker  Substance and Sexual Activity  . Alcohol use: Yes    Alcohol/week: 0.0 standard drinks  . Drug use: No  . Sexual activity: Yes  Other Topics Concern  . Not on file  Social History Narrative  . Not on file   Social Determinants of Health   Financial Resource Strain:   . Difficulty of Paying Living Expenses: Not on file  Food Insecurity:   . Worried About Programme researcher, broadcasting/film/video in the Last Year: Not on file  . Ran Out of Food in the Last Year: Not on file  Transportation Needs:   . Lack of Transportation (Medical): Not on file  .  Lack of Transportation (Non-Medical): Not on file  Physical Activity:   . Days of Exercise per Week: Not on file  . Minutes of Exercise per Session: Not on file  Stress:   . Feeling of Stress : Not on file  Social Connections:   . Frequency of Communication with Friends and Family: Not on file  . Frequency of Social Gatherings with Friends and Family: Not on file  . Attends Religious Services: Not on file  . Active Member of Clubs or Organizations: Not on file  . Attends Banker Meetings: Not on file  . Marital Status: Not on file    ECOG Status: 2 - Symptomatic, <50% confined to bed  Review of Systems: A 12 point  ROS discussed and pertinent positives are indicated in the HPI above.  All other systems are negative.  Review of Systems  Vital Signs: BP (!) 146/98   Pulse (!) 105   Temp (S) (!) 100.7 F (38.2 C) (Rectal)   Resp (!) 22   Ht 5' 6.5" (1.689 m)   Wt 94 kg   SpO2 92%   BMI 32.95 kg/m   Physical Exam  Imaging: CT Head Wo Contrast  Result Date: 09/12/2019 CLINICAL DATA:  69 year old female status post fall, trauma. Dizziness and shortness of breath. EXAM: CT HEAD WITHOUT CONTRAST TECHNIQUE: Contiguous axial images were obtained from the base of the skull through the vertex without intravenous contrast. COMPARISON:  None. FINDINGS: Brain: Cerebral volume is within normal limits for age. No midline shift, ventriculomegaly, mass effect, evidence of mass lesion, intracranial hemorrhage or evidence of cortically based acute infarction. Gray-white matter differentiation is within normal limits throughout the brain. Vascular: Calcified atherosclerosis at the skull base. No suspicious intracranial vascular hyperdensity. Skull: No fracture identified.  Normal bone mineralization. Sinuses/Orbits: Minimal mucosal thickening and bubbly opacity in the left sphenoid sinus. Other visualized paranasal sinuses and mastoids are well pneumatized. Other: Left posterior convexity mild scalp hematoma (series 4, image 41). Underlying calvarium intact. No other orbit or scalp soft tissue injury identified. IMPRESSION: 1. Left posterior convexity scalp hematoma without underlying skull fracture. 2. Normal for age non contrast CT appearance of the brain. Electronically Signed   By: Odessa Fleming M.D.   On: 09/12/2019 16:24   CT Angio Chest PE W/Cm &/Or Wo Cm  Result Date: 09/12/2019 CLINICAL DATA:  69 year old female status post fall, trauma. Dizziness and shortness of breath. Negative for COVID-19 today. EXAM: CT ANGIOGRAPHY CHEST WITH CONTRAST TECHNIQUE: Multidetector CT imaging of the chest was performed using the standard  protocol during bolus administration of intravenous contrast. Multiplanar CT image reconstructions and MIPs were obtained to evaluate the vascular anatomy. CONTRAST:  89mL OMNIPAQUE IOHEXOL 350 MG/ML SOLN COMPARISON:  Cervical spine CT today reported separately. FINDINGS: Cardiovascular: Good contrast bolus timing in the pulmonary arterial tree. Positive pulmonary emboli including saddle embolus (series 8, image 67). Thrombus in both main pulmonary arteries, and involving all bilateral lobar branches. Abnormal RV to LV ratio (series 6, image 185) compatible with right heart strain. There is a small superimposed pericardial effusion. Negative visible aorta aside from mild atherosclerosis. Faint calcified coronary artery atherosclerosis (series 6, image 147). Mediastinum/Nodes: Negative.  No lymphadenopathy. Lungs/Pleura: Trace bilateral layering pleural effusions. Major airways are patent. Mild dependent and perihilar atelectasis. No confluent pulmonary infarct at this time. Upper Abdomen: Negative visible liver, gallbladder, pancreas, adrenal glands, left kidney and bowel. Heterogeneous enhancement of the spleen which can be seen with early contrast timing due  to red pulp/white pulp perfusion differences. However, the confluent hypodensity seen on series 5, image 101 is atypical. Still, there is no perisplenic inflammation or fluid evident. Musculoskeletal: No acute or suspicious osseous lesion. Review of the MIP images confirms the above findings. IMPRESSION: 1. Positive for Acute PE with saddle embolus, a large clot burden, and CT evidence of right heart strain. 2. Small pericardial effusion and pleural effusions. No pulmonary infarct at this time. 3. Heterogeneous enhancement of the spleen, somewhat atypical for the physiologic early splenic enhancement. However, the absence of perisplenic inflammation or fluid argues against acute splenic infarcts. Critical Value/emergent results were called by telephone at the  time of interpretation on 09/12/2019 at 4:33 pm to PA Trisha MangleKaren Sophia who verbally acknowledged these results. Electronically Signed   By: Odessa FlemingH  Hall M.D.   On: 09/12/2019 16:46   CT Cervical Spine Wo Contrast  Result Date: 09/12/2019 CLINICAL DATA:  69 year old female status post fall, trauma. Dizziness and shortness of breath. EXAM: CT CERVICAL SPINE WITHOUT CONTRAST TECHNIQUE: Multidetector CT imaging of the cervical spine was performed without intravenous contrast. Multiplanar CT image reconstructions were also generated. COMPARISON:  Head CT today reported separately. FINDINGS: Alignment: Straightening of cervical lordosis. Mild degenerative appearing retrolisthesis of C6 on C7. Cervicothoracic junction alignment is within normal limits. Bilateral posterior element alignment is within normal limits. Skull base and vertebrae: Visualized skull base is intact. No atlanto-occipital dissociation. No acute osseous abnormality identified. Soft tissues and spinal canal: No prevertebral fluid or swelling. No visible canal hematoma. Surgical clips about the left thyroid compatible with prior surgery. Otherwise negative noncontrast visible neck soft tissues. Disc levels: Widespread right side cervical facet arthropathy, lower cervical disc and endplate degeneration. 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 Subsequently, mild spinal stenosis is suspected at C4-C5. Upper chest: Visible upper thoracic levels appear intact. Calcified aortic atherosclerosis. Negative visible lung apices. IMPRESSION: 1. No acute traumatic injury identified in the cervical spine. 2. 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 Mild degenerative spinal stenosis suspected. Electronically Signed   By: Odessa FlemingH  Hall M.D.   On: 09/12/2019 16:28   DG Chest Port 1 View  Result Date: 09/12/2019 CLINICAL DATA:  Shortness of breath. EXAM:  PORTABLE CHEST 1 VIEW COMPARISON:  None. FINDINGS: The heart size and mediastinal contours are within normal limits. Both lungs are clear. No pneumothorax or pleural effusion is noted. The visualized skeletal structures are unremarkable. IMPRESSION: No active disease. Electronically Signed   By: Lupita RaiderJames  Green Jr M.D.   On: 09/12/2019 14:43    Labs:  CBC: Recent Labs    09/12/19 1256  WBC 10.1  HGB 13.3  HCT 43.0  PLT 209    COAGS: No results for input(s): INR, APTT in the last 8760 hours.  BMP: Recent Labs    09/12/19 1256  NA 141  K 4.4  CL 108  CO2 22  GLUCOSE 147*  BUN 15  CALCIUM 8.1*  CREATININE 1.34*  GFRNONAA 41*  GFRAA 47*    LIVER FUNCTION TESTS: No results for input(s): BILITOT, AST, ALT, ALKPHOS, PROT, ALBUMIN in the last 8760 hours.  TUMOR MARKERS: No results for input(s): AFPTM, CEA, CA199, CHROMGRNA in the last 8760 hours.  Assessment and Plan:  Julia Gomez is a 69 y.o. female with no significant past medical history who presented to the emergency department today with progressive shortness of breath after experiencing a fall several days ago.  Subsequent chest CTA performed emergency department demonstrates large volume of bilateral pulmonary embolism.  Patient will be evaluated by the critical care service the an appropriate candidate for attempted catheter directed bilateral pulmonary arterial thrombolysis.  Patient is seen in the emergency department accompanied by her husband.  She continues complain of shortness of breath though states this has been improved since starting supplemental oxygen.  Patient denies history of cancer.  She denies recent surgery or significant trauma.  Risks and benefits bilateral pulmonary artery catheter directed thrombolysis was discussed with the patient including, but not limited to bleeding, possible life threatening bleeding and need for blood product transfusion, vascular injury, stroke, contrast induced renal  failure, and/or infection.  All of the patient's questions were answered, patient is agreeable to proceed. Consent signed and in chart.  Thank you for this interesting consult.  I greatly enjoyed meeting Julia Gomez and look forward to participating in their care.  A copy of this report was sent to the requesting provider on this date.  Electronically Signed: Simonne Come, MD 09/12/2019, 6:10 PM   I spent a total of 20 Minutes in face to face in clinical consultation, greater than 50% of which was counseling/coordinating care for bilateral pulmonary arterial catheter directed thrombolysis

## 2019-09-12 NOTE — ED Provider Notes (Signed)
Radiology called and reports pt has a PE and enlarged spleen.  I spoke to Critical care who will see for evaluation    Julia Gomez 09/12/19 1651    Pollyann Savoy, MD 09/12/19 [phone]

## 2019-09-12 NOTE — ED Provider Notes (Addendum)
Care of the patient assumed at the change of shift. She had a syncopal episode a few days ago, feeling poorly since.  Physical Exam  BP 99/85   Pulse 97   Temp (S) (!) 100.7 F (38.2 C) (Rectal)   Resp (!) 24   Ht 5' 6.5" (1.689 m)   Wt 94 kg   SpO2 (!) 75%   BMI 32.95 kg/m   Physical Exam Resting comfortably, no respiratory distress. SpO2 on monitor does not have a good wave form or correlating with heart rate.   ED Course/Procedures   Clinical Course as of Sep 11 1620  Tue Sep 12, 2019  1329 SpO2(!): 78 % [MV]  1329 Pulse Rate(!): 118 [MV]  1336 rectal  TempMarland Kitchen)(S): 100.7 F (38.2 C) [MV]    Clinical Course User Index [MV] Tanda Rockers, PA-C    .Critical Care Performed by: Pollyann Savoy, MD Authorized by: Pollyann Savoy, MD   Critical care provider statement:    Critical care time (minutes):  35   Critical care time was exclusive of:  Separately billable procedures and treating other patients   Critical care was necessary to treat or prevent imminent or life-threatening deterioration of the following conditions:  Respiratory failure and circulatory failure   Critical care was time spent personally by me on the following activities:  Discussions with consultants, evaluation of patient's response to treatment, examination of patient, ordering and performing treatments and interventions, ordering and review of laboratory studies, ordering and review of radiographic studies, pulse oximetry, re-evaluation of patient's condition, obtaining history from patient or surrogate and review of old charts   I assumed direction of critical care for this patient from another provider in my specialty: yes      MDM  CTA images reviewed, appears to show large bilateral PE burden with RV>LV. Will consult IR for consideration of thrombolytics.   4:56 PM Spoke with Dr. Grace Isaac, IR who will coordinate with admitting ICU team regarding thrombolytics.      Pollyann Savoy,  MD 09/12/19 [phone]

## 2019-09-12 NOTE — ED Notes (Signed)
Husband [name] -- would like update-- [phone]

## 2019-09-12 NOTE — H&P (Addendum)
NAME:  Julia Gomez, MRN:  [id number], DOB:  May 12, 1950, LOS: 0 ADMISSION DATE:  09/12/2019, CONSULTATION DATE: 09/12/2019 REFERRING MD: Campbell Lerner, PA, CHIEF COMPLAINT: Pulmonary embolism  Brief History   Julia Gomez is a 68 year old female with past medical history of prediabetes, hyperlipidemia, depression who was admitted for hypoxia and found to have a saddle pulmonary embolism.  She will undergo catheter directed thrombolysis and admitted to the ICU.  History of present illness   Julia Gomez is a 69 year old female with past medical history of prediabetes, hyperlipidemia, depression who was admitted for fatigue and short of breath for the last several days, found to have a saddle pulmonary embolism on CTA chest.  Patient had a recent fall on 09/06/2019 and she saw her PCP.  She states that she she was " blacked out" and fell and hit her head.   Patient is seen at bedside.  She appears comfortable and in no acute respiratory distress.  She denies chest pain, bilateral lower extremity pain, prior history of stroke or internal bleeding.  Patient states that she is not on any blood thinner at home.  She is currently satting well on 5 L nasal cannula.  Blood pressure was 120/80 in the room.  She was tachycardic at 109.  Past Medical History  Prediabetes Hyperlipidemia Depression  Significant Hospital Events   8/24 catheter directed thrombolysis  Consults:  IR  Procedures:  8/24 catheter directed thrombolysis  Significant Diagnostic Tests:  8/24 CTA chest >> Positive for Acute PE with saddle embolus, a large clot burden, and CT evidence of right heart strain. Small pericardial effusion and pleural effusions. No pulmonary infarct at this time. Heterogeneous enhancement of the spleen, somewhat atypical for the physiologic early splenic enhancement. However, the absence of perisplenic inflammation or fluid argues against acute splenic infarcts.  8/24 head CT >> Left posterior  convexity scalp hematoma without underlying skull fracture. Normal for age non contrast CT appearance of the brain.  8/24 cervical CT >> 1. No acute traumatic injury identified in the cervical spine. 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 Mild degenerative spinal stenosis suspected.  Micro Data:  Covid negative  Antimicrobials:    Interim history/subjective:  As above  Objective   Blood pressure 99/85, pulse 97, temperature (S) (!) 100.7 F (38.2 C), temperature source (S) Rectal, resp. rate (!) 24, height 5' 6.5" (1.689 m), weight 94 kg, SpO2 (!) 75 %.        Intake/Output Summary (Last 24 hours) at 09/12/2019 1734 Last data filed at 09/12/2019 1646 Gross per 24 hour  Intake 1000 ml  Output --  Net 1000 ml   Filed Weights   09/12/19 1255  Weight: 94 kg    Examination: Physical Exam Constitutional:      General: She is not in acute distress.    Appearance: She is not toxic-appearing.  HENT:     Head: Normocephalic.  Eyes:     General: No scleral icterus.       Right eye: No discharge.        Left eye: No discharge.     Conjunctiva/sclera: Conjunctivae normal.  Cardiovascular:     Rate and Rhythm: Regular rhythm. Tachycardia present.     Heart sounds: No murmur heard.  No friction rub. No gallop.      Comments: S1-S2, no murmur Pulmonary:     Effort: No respiratory distress.     Breath sounds: Normal breath sounds.  Abdominal:  General: Bowel sounds are normal. There is no distension.     Palpations: Abdomen is soft.  Musculoskeletal:        General: No tenderness. Normal range of motion.     Right lower leg: No edema.     Left lower leg: No edema.  Skin:    Coloration: Skin is not jaundiced.     Findings: No bruising.  Neurological:     Mental Status: She is alert.  Psychiatric:        Mood and Affect: Mood normal.        Behavior: Behavior normal.      Resolved Hospital  Problem list     Assessment & Plan:  Saddle pulmonary embolism without hypotension Hypoxia requiring nasal cannula History of syncopal fall with head involvement Hyperlipidemia AKI VS CKD Prediabetes  Plan:  Saddle pulmonary embolism without hypertension Hypoxia requiring nasal cannula History of syncopal fall with head involvement CTA shows an acute PE with a large saddle embolism burden.  Echo done at bedside shows right heart strain and blood clots in right femoral vein.  Her systolic blood pressure has been greater than 90 and she was hemodynamically stable.  Patient however requires 5L to maintain O2 sat greater than 92.  Given recent fall where she hit her head, catheter directed thrombolysis seems to be a safer option even though CT head did not show any intracranial hemorrhage. -Anticoagulate with heparin drip -Catheter directed thrombolysis with IR. -Continue oxygen.  Wean as needed -Continue to monitor vital signs -Pending INR -Will evaluate cause of syncope when patient is stable   AKI versus CKD Unknown baseline creatinine.  Most recent creatinine at PCP was 1.13 and BUN of 15.  Current creatinine 1.34..  Will continue to monitor -BMP in a.m. -Follow intake and output   Hyperlipidemia Will restart statin   Prediabetes Check A1c.  Sliding scale if needed.  N.p.o. for now   Best practice:  Diet: N.p.o. Pain/Anxiety/Delirium protocol (if indicated): N/A VAP protocol (if indicated): N/A DVT prophylaxis: Heparin drip GI prophylaxis: N/A Glucose control: N/A Mobility: Bedrest Code Status: Full Family Communication: Updated husband Disposition: ICU  Critical care time: 40 min     Doran Stabler, DO Internal Medicine Residency My pager: [phone]

## 2019-09-12 NOTE — ED Notes (Signed)
Pt had loose light brown stool- almost liquid. States that this is the first she has had

## 2019-09-13 ENCOUNTER — Inpatient Hospital Stay (HOSPITAL_COMMUNITY): Payer: Medicare HMO

## 2019-09-13 DIAGNOSIS — I2699 Other pulmonary embolism without acute cor pulmonale: Secondary | ICD-10-CM

## 2019-09-13 DIAGNOSIS — I2602 Saddle embolus of pulmonary artery with acute cor pulmonale: Secondary | ICD-10-CM

## 2019-09-13 DIAGNOSIS — I82412 Acute embolism and thrombosis of left femoral vein: Secondary | ICD-10-CM

## 2019-09-13 DIAGNOSIS — J9601 Acute respiratory failure with hypoxia: Secondary | ICD-10-CM

## 2019-09-13 HISTORY — PX: IR THROMB F/U EVAL ART/VEN FINAL DAY (MS): IMG5379

## 2019-09-13 LAB — HEPARIN LEVEL (UNFRACTIONATED)
Heparin Unfractionated: 0.54 IU/mL (ref 0.30–0.70)
Heparin Unfractionated: 0.94 IU/mL — ABNORMAL HIGH (ref 0.30–0.70)
Heparin Unfractionated: 1.09 IU/mL — ABNORMAL HIGH (ref 0.30–0.70)
Heparin Unfractionated: 1.4 IU/mL — ABNORMAL HIGH (ref 0.30–0.70)
Heparin Unfractionated: 1.98 IU/mL — ABNORMAL HIGH (ref 0.30–0.70)

## 2019-09-13 LAB — BASIC METABOLIC PANEL
Anion gap: 6 (ref 5–15)
BUN: 13 mg/dL (ref 8–23)
CO2: 22 mmol/L (ref 22–32)
Calcium: 7.1 mg/dL — ABNORMAL LOW (ref 8.9–10.3)
Chloride: 110 mmol/L (ref 98–111)
Creatinine, Ser: 0.86 mg/dL (ref 0.44–1.00)
GFR calc Af Amer: >60 mL/min (ref >60)
GFR calc non Af Amer: >60 mL/min (ref >60)
Glucose, Bld: 122 mg/dL — ABNORMAL HIGH (ref 70–99)
Potassium: 5.1 mmol/L (ref 3.5–5.1)
Sodium: 138 mmol/L (ref 135–145)

## 2019-09-13 LAB — CBC
HCT: 36.8 % (ref 36.0–46.0)
HCT: 37.1 % (ref 36.0–46.0)
HCT: 38.6 % (ref 36.0–46.0)
Hemoglobin: 11.5 g/dL — ABNORMAL LOW (ref 12.0–15.0)
Hemoglobin: 11.7 g/dL — ABNORMAL LOW (ref 12.0–15.0)
Hemoglobin: 12.5 g/dL (ref 12.0–15.0)
MCH: 29.2 pg (ref 26.0–34.0)
MCH: 29.4 pg (ref 26.0–34.0)
MCH: 30.4 pg (ref 26.0–34.0)
MCHC: 31 g/dL (ref 30.0–36.0)
MCHC: 31.8 g/dL (ref 30.0–36.0)
MCHC: 32.4 g/dL (ref 30.0–36.0)
MCV: 92.5 fL (ref 80.0–100.0)
MCV: 93.9 fL (ref 80.0–100.0)
MCV: 94.2 fL (ref 80.0–100.0)
Platelets: 106 10*3/uL — ABNORMAL LOW (ref 150–400)
Platelets: 107 10*3/uL — ABNORMAL LOW (ref 150–400)
Platelets: 156 10*3/uL (ref 150–400)
RBC: 3.94 MIL/uL (ref 3.87–5.11)
RBC: 3.98 MIL/uL (ref 3.87–5.11)
RBC: 4.11 MIL/uL (ref 3.87–5.11)
RDW: 15 % (ref 11.5–15.5)
RDW: 15.4 % (ref 11.5–15.5)
RDW: 15.4 % (ref 11.5–15.5)
WBC: 10.1 10*3/uL (ref 4.0–10.5)
WBC: 9.4 10*3/uL (ref 4.0–10.5)
WBC: 9.6 10*3/uL (ref 4.0–10.5)
nRBC: 0 % (ref 0.0–0.2)
nRBC: 0 % (ref 0.0–0.2)
nRBC: 0 % (ref 0.0–0.2)

## 2019-09-13 LAB — ECHOCARDIOGRAM COMPLETE
Calc EF: 51.7 %
Height: 66.5 in
S' Lateral: 2.9 cm
Single Plane A2C EF: 60 %
Single Plane A4C EF: 40.8 %
Weight: 3643.76 oz

## 2019-09-13 LAB — MRSA PCR SCREENING: MRSA by PCR: NEGATIVE

## 2019-09-13 LAB — MAGNESIUM: Magnesium: 2 mg/dL (ref 1.7–2.4)

## 2019-09-13 LAB — HIV ANTIBODY (ROUTINE TESTING W REFLEX): HIV Screen 4th Generation wRfx: NONREACTIVE

## 2019-09-13 LAB — TROPONIN I (HIGH SENSITIVITY): Troponin I (High Sensitivity): 360 ng/L (ref <18)

## 2019-09-13 LAB — FIBRINOGEN
Fibrinogen: 307 mg/dL (ref 210–475)
Fibrinogen: 311 mg/dL (ref 210–475)

## 2019-09-13 LAB — LACTIC ACID, PLASMA: Lactic Acid, Venous: 1.7 mmol/L (ref 0.5–1.9)

## 2019-09-13 LAB — PHOSPHORUS: Phosphorus: 2.8 mg/dL (ref 2.5–4.6)

## 2019-09-13 MED ORDER — SODIUM CHLORIDE 0.9% FLUSH
3.0000 mL | Freq: Two times a day (BID) | INTRAVENOUS | Status: DC
  Administered 2019-09-13 – 2019-09-17 (×7): 3 mL via INTRAVENOUS

## 2019-09-13 MED ORDER — SODIUM CHLORIDE 0.9 % IV SOLN: 250.0000 mL | INTRAVENOUS | Status: DC | PRN

## 2019-09-13 MED ORDER — CALCIUM GLUCONATE-NACL 2-0.675 GM/100ML-% IV SOLN
2.0000 g | Freq: Once | INTRAVENOUS | Status: AC
  Administered 2019-09-13: 2000 mg via INTRAVENOUS
  Filled 2019-09-13: qty 100

## 2019-09-13 MED ORDER — CHOLESTYRAMINE 4 G PO PACK
4.0000 g | PACK | Freq: Four times a day (QID) | ORAL | Status: DC | PRN
  Filled 2019-09-13: qty 1

## 2019-09-13 MED ORDER — LOPERAMIDE HCL 2 MG PO CAPS
2.0000 mg | ORAL_CAPSULE | Freq: Once | ORAL | Status: AC
  Administered 2019-09-13: 2 mg via ORAL
  Filled 2019-09-13: qty 1

## 2019-09-13 MED ORDER — FENTANYL CITRATE (PF) 100 MCG/2ML IJ SOLN
25.0000 ug | Freq: Once | INTRAMUSCULAR | Status: AC
  Administered 2019-09-13: 25 ug via INTRAVENOUS
  Filled 2019-09-13: qty 2

## 2019-09-13 MED ORDER — "THROMBI-PAD 3""X3"" EX PADS"
1.0000 | MEDICATED_PAD | Freq: Once | CUTANEOUS | Status: DC
  Filled 2019-09-13: qty 1

## 2019-09-13 MED ORDER — SODIUM CHLORIDE 0.9% FLUSH: 3.0000 mL | INTRAVENOUS | Status: DC | PRN

## 2019-09-13 NOTE — Progress Notes (Addendum)
IR.  Patient with history of submassive PE s/p bilateral catheter directed pulmonary arterial thrombolysis via right femoral approach 09/12/2019 by Dr. Grace Isaac; sheaths removed this AM.  Received call from RN stating that patient had oozing from right femoral puncture site. Went to evaluate bedside. Upon arrival, patient laying in bed at approximate 30 degree angle. Patient was laid completely flat. Right femoral puncture saturated with blood- this was removed and manual pressure was held (starting at 1432, ending at 1302- 30 minutes total) with minimal oozing still noted from site. In addition, noted to have palpable firmness superior and medial to puncture site- this was pushed out to best of my ability, minimal firmness noted at medial aspect of puncture site. Pressure dressing was applied to site. Site reviewed with Diannia Ruder, RN and Dr. Merrily Pew.  Patient is currently on IV Heparin- probable cause of continued oozing. This is venous- not arterial. Recommend patient lay flat x 1 hour- please lay flat if continued to ooze. Can place sandbag to site but please remove syringe from pressure dressing prior. If continues to bleed, recommend holding Heparin x 2 hours.  Please call IR with questions/concerns.   Waylan Boga Denicia Pagliarulo, PA-C 09/13/2019, 3:32 PM

## 2019-09-13 NOTE — Progress Notes (Signed)
eLink Physician-Brief Progress Note Patient Name: Julia Gomez DOB: September 18, 1950 MRN: [id number]   Date of Service  09/13/2019  HPI/Events of Note  Diarrhea  eICU Interventions  Plan: 1. Questran 4 gm PO Q 6 hours PRN diarrhea.      Intervention Category Major Interventions: Other:  [name] 09/13/2019, 12:20 AM

## 2019-09-13 NOTE — Progress Notes (Signed)
ANTICOAGULATION CONSULT NOTE  Pharmacy Consult for heparin Indication: saddle pulmonary embolus  Allergies  Allergen Reactions  . Codeine Other (See Comments)    numbness    Patient Measurements: Height: 5' 6.5" (168.9 cm) Weight: 103.3 kg (227 lb 11.8 oz) IBW/kg (Calculated) : 60.45 Heparin Dosing Weight: 83.4kg  Vital Signs: Temp: 99.1 F (37.3 C) (08/25 1600) Temp Source: Oral (08/25 1600) BP: 133/72 (08/25 1800) Pulse Rate: 94 (08/25 1800)  Labs: Recent Labs    09/12/19 1256 09/12/19 1256 09/12/19 1755 09/12/19 2301 09/12/19 2301 09/13/19 0036 09/13/19 0036 09/13/19 0517 09/13/19 0600 09/13/19 0914 09/13/19 1850  HGB 13.3   < >  --  12.1   < > 12.5   < > 11.5*  --   --  11.7*  HCT 43.0   < >  --  38.5   < > 38.6  --  37.1  --   --  36.8  PLT 209   < >  --  144*   < > 156  --  106*  --   --  107*  APTT  --   --  27  --   --   --   --   --   --   --   --   LABPROT  --   --  13.7  --   --   --   --   --   --   --   --   INR  --   --  1.1  --   --   --   --   --   --   --   --   HEPARINUNFRC  --   --   --  1.40*   < > 0.94*   < >  --  1.98* 1.09* 0.54  CREATININE 1.34*  --   --   --   --   --   --  0.86  --   --   --   TROPONINIHS  --   --   --  360*  --   --   --   --   --   --   --    < > = values in this interval not displayed.    Estimated Creatinine Clearance: 77.8 mL/min (by C-G formula based on SCr of 0.86 mg/dL).   Medical History: Past Medical History:  Diagnosis Date  . Allergy   . Asthma   . Depression   . Thyroid disease     Medications:  Scheduled:  . acetaminophen  650 mg Oral Once  . Chlorhexidine Gluconate Cloth  6 each Topical Daily  . docusate sodium  100 mg Oral Daily  . mouth rinse  15 mL Mouth Rinse BID  . polyethylene glycol  17 g Oral Daily  . sodium chloride flush  3 mL Intravenous Q12H  . sodium chloride flush  3 mL Intravenous Q12H  . Thrombi-Pad  1 each Topical Once   Infusions:  . sodium chloride    . sodium  chloride    . sodium chloride Stopped (09/13/19 1007)  . sodium chloride Stopped (09/13/19 1013)  . heparin 1,000 Units/hr (09/13/19 1800)    Assessment: 69yo female on heparin with initial dosing for saddle PE, underwent EKOS 8/24 (+alteplase 12mg  x2). Central line was not placed and pt cannot be stuck for labs given alteplase, so labs are being drawn from PIV. KVO is running in R AC line; heparin is running  in R hand line.  One supratherapeutic heparin level was reported at 1.98. However, it is likely that this value was heparin contaminated as it was drawn from the PIV with heparin running. Repeat heparin level remains supratherapeutic at 1.01 drawn from [facility] PIV. Nurse does not report any significant bleeding or problems with the infusion at this time.   PM f/u > discussed with Dr. Zachery Conch - patient only had TPA in EKOS, not systemically, and should be okay for phlebotomy sticks since relatively non-invasive.  Heparin level collected by peripheral stick this evening is within goal range at 0.54.  Goal of Therapy:  Heparin level 0.3-0.7 units/ml Monitor platelets by anticoagulation protocol: Yes   Plan:  Continue IV heparin at 1000 units/hr Confirm heparin level in 6 hrs Monitor signs/symptoms of bleeding, CBC, and heparin levels daily.  Reece Leader, Colon Flattery, BCCP Clinical Pharmacist  09/13/2019 8:10 PM   [facility] pharmacy phone numbers are listed on [email or web address]

## 2019-09-13 NOTE — TOC Benefit Eligibility Note (Signed)
Transition of Care [facility]) Benefit Eligibility Note    Patient Details  Name: Julia Gomez MRN: [id number] Date of Birth: 26-Nov-1950   Medication/Dose: Alveda Reasons 10 MG  , XARELTO 20 MG DAILY ,  ZARELTO 15 MG ; NON-FORMULARY  , RIVAROXABAN : NON-FORMULARY  Covered?: Yes  Tier: 3 Drug  Prescription Coverage Preferred Pharmacy: [facility]  Spoke with Person/Company/Phone Number:: [employer]  @ [employer] # [phone]  Co-Pay: $ 47.00   FOR EACH PRESCRIPTION  Prior Approval: No  Deductible: Met  Additional Notes: ELIQUIS  2.5 MG BID  , ELIQUIS  5 MG BID  , ELIQUIS 10MG BID :NON-FORMULARY , APIXABAN : NON-FORMULARY    Memory Argue Phone Number: 09/13/2019, 4:32 PM

## 2019-09-13 NOTE — Progress Notes (Signed)
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 Echo done at bedside shows right heart strain and blood clots in right femoral vein.  Her systolic blood pressure has been greater than 90 and she was hemodynamically stable. She was required 5L O2 to keep sats above 92 at that time.  Given recent fall where she hit her head, catheter directed thrombolysis seems to be a safer option even though CT head did not show any intracranial hemorrhage. On 8/24, patient is s/p tpa showing signs of clinical improvement. Platelets down to 106 from 209, 4Ts score of 2, low probability of HIT at this time.  -Anticoagulate with heparin drip -Continue oxygen. Currently on 5L but satting around 95 consistently, Will begin to wean as tolerated.  -Continue to monitor vital signs -Pending INR -Will evaluate cause of syncope when patient is stable  AKI, mild Unknown baseline creatinine.  Most recent creatinine at PCP was 1.13 and BUN of 15. Cr on admission of 1.34, down to 0.89 on 8/24. - Follow intake and output  Hyperlipidemia - Restart atorvastatin 20 mg QD  Depression - Continue home fluoxetine 60 mg QD  Prediabetes A1c pending. Sliding scale if needed. Will monitor glucose  SUMMARY OF TODAY'S PLAN:  -s/p tpa with clinical improvement, will continue heparin and wean O2  BRIEF HISTORY:    Julia Gomez is a 69 year old female with past medical history of prediabetes, hyperlipidemia, depression who was admitted for hypoxia and found to have a saddle pulmonary embolism  She will undergo catheter directed thrombolysis and admitted to the  ICU. HISTORY OF PRESENT ILLNESS   Julia Gomez is a 69 year old female with past medical history of prediabetes, hyperlipidemia, depression who was admitted for fatigue and short of breath for the last several days, found to have a saddle pulmonary embolism on CTA chest.  Patient had a recent fall on 09/06/2019 and she saw her PCP.  She states that she she was " blacked out" and fell and hit her head.   Patient is seen at bedside.  She appears comfortable and in no acute respiratory distress.  She denies chest pain, bilateral lower extremity pain, prior history of stroke or internal bleeding.  Patient states that she is not on any blood thinner at home.  She is currently satting well on 5 L nasal cannula.  Blood pressure was 120/80 in the room.  She was tachycardic at 109. SIGNIFICANT PAST MEDICAL HISTORY   Depression Hyperlipidemia Essential Tremor Urinary incontinence SIGNIFICANT EVENTS:  8/24 catheter directed thrombolysis STUDIES:   8/24 CTA chest >> Positive for Acute PE with saddle embolus, a large clot burden, and CT evidence of right heart strain. Small pericardial effusion and pleural effusions. No pulmonary infarct at this time. Heterogeneous enhancement of the spleen, somewhat atypical for the physiologic early splenic enhancement. However, the absence of perisplenic inflammation or fluid argues against acute splenic infarcts.  8/24 head CT >> Left posterior convexity scalp hematoma without underlying skull fracture. Normal for age non contrast CT appearance of the brain.  8/24 cervical CT >> 1. No acute traumatic injury identified in the cervical spine. 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 Mild degenerative spinal stenosis suspected. CULTURES:  COVID negative 8/24 Blood culture 8/24 >>no growth at 24hrs ANTIBIOTICS:  n/a LINES/TUBES:  2 PIVs CONSULTANTS:  IR SUBJECTIVE:  Patient feeling much improved  from her initial presentation States that she feels somewhat SOB from laying flat due to her catheter.  Denies any CP at this time, denies any focal weakness or loss of sensation  CONSTITUTIONAL: BP (!) 158/86   Pulse 97   Temp 99.1 F (37.3 C) (Oral)   Resp (!) 23   Ht 5' 6.5" (1.689 m)   Wt 103.3 kg   SpO2 93%   BMI 36.21 kg/m   I/O last 3 completed shifts: In: 2404.6 [I.V.:1404.6; IV Piggyback:1000] Out: -      PHYSICAL EXAM: General: NAD, ill-appearing Neuro: AxOx3, sensation intact, no focal weakness, Cns II-XII grossly intact HEENT: No scleral icterus, EOM intact Cardiovascular: RRR, no murmurs Lungs: coarse breath sounds, no wheezing or crackles Abdomen: soft, non-tender Extremities: No LE edema, some bleeding from her R leg catheter site, improved since removal of catheter Skin: No rash  RESOLVED PROBLEM LIST  n/a Best Practice / Goals of Care / Disposition.   DVT prophylaxis: Heparin drip GI prophylaxis: N/A Glucose control: N/A Mobility: Bedrest Code Status: Full Family Communication: Updated husband at bedside Disposition: ICU  LABS  Glucose Recent Labs  Lab 09/12/19 2207  GLUCAP 97    BMET Recent Labs  Lab 09/12/19 1256 09/13/19 0517  NA 141 138  K 4.4 5.1  CL 108 110  CO2 22 22  BUN 15 13  CREATININE 1.34* 0.86  GLUCOSE 147* 122*    Liver Enzymes No results for input(s): AST, ALT, ALKPHOS, BILITOT, ALBUMIN in the last 168 hours.  Electrolytes Recent Labs  Lab 09/12/19 1256 09/13/19 0517  CALCIUM 8.1* 7.1*  MG  --  2.0  PHOS  --  2.8    CBC Recent Labs  Lab 09/12/19 2301 09/13/19 0036 09/13/19 0517  WBC 9.6 10.1 9.6  HGB 12.1 12.5 11.5*  HCT 38.5 38.6 37.1  PLT 144* 156 106*    ABG No results for input(s): PHART, PCO2ART, PO2ART in the last 168 hours.  Coag's Recent Labs  Lab 09/12/19 1755  APTT 27  INR 1.1    Sepsis Markers Recent Labs  Lab 09/12/19 1256 09/12/19 1400 09/12/19 2301  LATICACIDVEN  --   1.8 1.7  PROCALCITON <0.10  --   --     Cardiac Enzymes No results for input(s): TROPONINI, PROBNP in the last 168 hours.  PAST MEDICAL HISTORY :   She  has a past medical history of Allergy, Asthma, Depression, and Thyroid disease.  PAST SURGICAL HISTORY:  She  has a past surgical history that includes Eye surgery; Breast cyst aspiration (Left); Breast biopsy (Right); and IR INFUSION THROMBOL VENOUS INITIAL (MS) (09/12/2019).  Allergies  Allergen Reactions  . Codeine Other (See Comments)    numbness    No current facility-administered medications on file prior to encounter.   Current Outpatient Medications on File Prior to Encounter  Medication Sig  . atorvastatin (LIPITOR) 20 MG tablet Take 20 mg by mouth at bedtime.  . Biotin 10 MG CAPS Take 1 capsule by mouth daily.  . Calcium Carbonate-Vit D-Min (CALCIUM 1200 PO) Take 1 tablet by mouth daily.  . fish oil-omega-3 fatty acids 1000 MG capsule Take 1 g by mouth daily.   Marland Kitchen FLUoxetine (PROZAC) 20 MG tablet Take 60  mg by mouth in the morning.   . gabapentin (NEURONTIN) 300 MG capsule Take 300 mg by mouth 2 (two) times daily.  . primidone (MYSOLINE) 250 MG tablet Take 250 mg by mouth in the morning and at bedtime.  . trospium (SANCTURA) 20 MG tablet Take 20 mg by mouth 2 (two) times daily.    FAMILY HISTORY:   Her family history includes Aneurysm in her father; Arthritis in her maternal grandmother; COPD in her father; Diverticulitis in her mother; Heart disease in her maternal grandfather and paternal grandfather; Hyperlipidemia in her mother; Hypothyroidism in her mother.  SOCIAL HISTORY:  She  reports that she has never smoked. She does not have any smokeless tobacco history on file. She reports current alcohol use. She reports that she does not use drugs.  REVIEW OF SYSTEMS:    Per HPI, remainder of ROS negative  Anette Riedel, MS4

## 2019-09-13 NOTE — Progress Notes (Signed)
  Echocardiogram 2D Echocardiogram has been performed.  Julia Gomez 09/13/2019, 8:43 AM

## 2019-09-13 NOTE — Progress Notes (Signed)
eLink Physician-Brief Progress Note Patient Name: Julia Gomez DOB: [birth year] MRN: [id number]   Date of Service  09/13/2019  HPI/Events of Note  Continues to bleed from R groin procedure site.   eICU Interventions  Plan: 1. Per IR recommendation, hold heparin X 2 hours.      Intervention Category Major Interventions: Other:  [name] 09/13/2019, 10:37 PM

## 2019-09-13 NOTE — Progress Notes (Signed)
Pharmacy Electrolyte Replacement  Recent Labs: Calcium 7.1  Recent Labs    09/13/19 0517  K 5.1  MG 2.0  PHOS 2.8  CREATININE 0.86    Plan: calcium gluconate 2g IV x1   Calton Dach, PharmD PGY1 Pharmacy Resident 09/13/2019 8:13 AM

## 2019-09-13 NOTE — Progress Notes (Signed)
ANTICOAGULATION CONSULT NOTE - Follow Up Consult  Pharmacy Consult for heparin Indication: pulmonary embolus  Labs: Recent Labs    09/12/19 1256 09/12/19 1755 09/12/19 2301  HGB 13.3  --  12.1  HCT 43.0  --  38.5  PLT 209  --  144*  APTT  --  27  --   LABPROT  --  13.7  --   INR  --  1.1  --   HEPARINUNFRC  --   --  1.40*  CREATININE 1.34*  --   --   TROPONINIHS  --   --  360*    Assessment: 69yo female supratherapeutic on heparin with initial dosing for PE, now w/ lytic running, but unfortunately central line was not placed and pt cannot be stuck for labs given alteplase so labs are being drawn from PIV that is close to the PIV that heparin is running in (heparin running in R hand, labs drawn from R [facility]) so this lab is likely contaminated with heparin; no gtt issues per RN but she does note that blood oozes from sheath when pt is moved but quickly resolves when the pt is still.  Goal of Therapy:  Heparin level 0.3-0.7 units/ml   Plan:  Will decrease heparin gtt by 2 units/kg/hr to 1200 units/hr and check level with next scheduled lab draw.    Vernard Gambles, PharmD, BCPS  09/13/2019,12:31 AM

## 2019-09-13 NOTE — Progress Notes (Signed)
ANTICOAGULATION CONSULT NOTE - Initial Consult  Pharmacy Consult for heparin Indication: saddle pulmonary embolus  Allergies  Allergen Reactions  . Codeine Other (See Comments)    numbness    Patient Measurements: Height: 5' 6.5" (168.9 cm) Weight: 103.3 kg (227 lb 11.8 oz) IBW/kg (Calculated) : 60.45 Heparin Dosing Weight: 83.4kg  Vital Signs: Temp: 99.1 F (37.3 C) (08/25 0406) Temp Source: Oral (08/25 0406) BP: 158/86 (08/25 1000) Pulse Rate: 97 (08/25 1000)  Labs: Recent Labs    09/12/19 1256 09/12/19 1256 09/12/19 1755 09/12/19 2301 09/12/19 2301 09/13/19 0036 09/13/19 0517 09/13/19 0600 09/13/19 0914  HGB 13.3   < >  --  12.1   < > 12.5 11.5*  --   --   HCT 43.0   < >  --  38.5  --  38.6 37.1  --   --   PLT 209   < >  --  144*  --  156 106*  --   --   APTT  --   --  27  --   --   --   --   --   --   LABPROT  --   --  13.7  --   --   --   --   --   --   INR  --   --  1.1  --   --   --   --   --   --   HEPARINUNFRC  --   --   --  1.40*   < > 0.94*  --  1.98* 1.09*  CREATININE 1.34*  --   --   --   --   --  0.86  --   --   TROPONINIHS  --   --   --  360*  --   --   --   --   --    < > = values in this interval not displayed.    Estimated Creatinine Clearance: 77.8 mL/min (by C-G formula based on SCr of 0.86 mg/dL).   Medical History: Past Medical History:  Diagnosis Date  . Allergy   . Asthma   . Depression   . Thyroid disease     Medications:  Scheduled:  . acetaminophen  650 mg Oral Once  . Chlorhexidine Gluconate Cloth  6 each Topical Daily  . docusate sodium  100 mg Oral Daily  . mouth rinse  15 mL Mouth Rinse BID  . polyethylene glycol  17 g Oral Daily  . sodium chloride flush  3 mL Intravenous Q12H  . sodium chloride flush  3 mL Intravenous Q12H   Infusions:  . sodium chloride    . sodium chloride    . sodium chloride 20.8 mL/hr at 09/13/19 1000  . sodium chloride 20.8 mL/hr at 09/13/19 1000  . calcium gluconate    . heparin 1,200  Units/hr (09/13/19 1000)    Assessment: 68yo female on heparin with initial dosing for saddle PE, underwent EKOS 8/24 (+alteplase 12mg  x2). Central line was not placed and pt cannot be stuck for labs given alteplase, so labs are being drawn from PIV. KVO is running in R AC line; heparin is running in R hand line.  One supratherapeutic heparin level was reported at 1.98. However, it is likely that this value was heparin contaminated as it was drawn from the PIV with heparin running. Repeat heparin level remains supratherapeutic at 1.01 drawn from [facility] PIV. Nurse does not report any  significant bleeding or problems with the infusion at this time.   Goal of Therapy:  Heparin level 0.3-0.7 units/ml Monitor platelets by anticoagulation protocol: Yes   Plan:  Start heparin infusion at 1000 units/hr  6hr heparin level at 1700 Monitor signs/symptoms of bleeding, CBC, and heparin levels.   De Burrs  PGY1 Pharmacy Resident 09/13/2019,10:49 AM

## 2019-09-13 NOTE — Progress Notes (Signed)
eLink Physician-Brief Progress Note Patient Name: Julia Gomez DOB: 16-Jul-1950 MRN: [id number]   Date of Service  09/13/2019  HPI/Events of Note  Troponin = 360. Likely d/t submassive PE. Already getting catheter directed tPA.  eICU Interventions  Continue present management.      Intervention Category Major Interventions: Other:  [name] 09/13/2019, 12:39 AM

## 2019-09-13 NOTE — Care Management (Signed)
Benefit check for DOACs submitted

## 2019-09-13 NOTE — Progress Notes (Signed)
Lower extremity venous has been completed.   Preliminary results in CV Proc.   Blanch Media 09/13/2019 11:45 AM

## 2019-09-13 NOTE — Progress Notes (Signed)
Referring Physician(s): Dr. Karle Starch  Supervising Physician: Corrie Mckusick  Patient Status:  [facility] - In-pt  Chief Complaint: Submassive PE; s/p bilateral catheter directed pulmonary arterial thrombolysis by Dr. Pascal Lux 09/12/19.   Subjective: Patient in bed, alert and oriented. She denies significant pain or discomfort. TPA infusion recently completed. Sheaths still in right groin. Slow bleeding/oozing from the site all night long, per bedside RN. Dressing is saturated with blood. A towel with a 5 lb sandbag is over the site. Sheaths are due to be pulled this morning.   Allergies: Codeine  Medications: Prior to Admission medications   Medication Sig Start Date End Date Taking? Authorizing Provider  atorvastatin (LIPITOR) 20 MG tablet Take 20 mg by mouth at bedtime.   Yes [provider]  Biotin 10 MG CAPS Take 1 capsule by mouth daily.   Yes [provider]  Calcium Carbonate-Vit D-Min (CALCIUM 1200 PO) Take 1 tablet by mouth daily.   Yes [provider]  fish oil-omega-3 fatty acids 1000 MG capsule Take 1 g by mouth daily.    Yes [provider]  FLUoxetine (PROZAC) 20 MG tablet Take 60 mg by mouth in the morning.    Yes [provider]  gabapentin (NEURONTIN) 300 MG capsule Take 300 mg by mouth 2 (two) times daily.   Yes [provider]  primidone (MYSOLINE) 250 MG tablet Take 250 mg by mouth in the morning and at bedtime.   Yes [provider]  trospium (SANCTURA) 20 MG tablet Take 20 mg by mouth 2 (two) times daily.   Yes [provider]     Vital Signs: BP (!) 158/86   Pulse 97   Temp 99.1 F (37.3 C) (Oral)   Resp (!) 23   Ht 5' 6.5" (1.689 m)   Wt 227 lb 11.8 oz (103.3 kg)   SpO2 93%   BMI 36.21 kg/m   Physical Exam Constitutional:      General: She is not in acute distress. HENT:     Mouth/Throat:     Mouth: Mucous membranes are dry.  Cardiovascular:     Rate and Rhythm: Regular rhythm.  Tachycardia present.     Comments: Right groin vascular site. Dressings are saturated with blood but appears stable. Site is soft, no evidence of hematoma. Sheaths are in place with NS infusing.  Pulmonary:     Effort: Pulmonary effort is normal.     Comments: Expiratory crackles Neurological:     Mental Status: She is alert.     Imaging: CT Head Wo Contrast  Result Date: 09/12/2019 CLINICAL DATA:  69 year old female status post fall, trauma. Dizziness and shortness of breath. EXAM: CT HEAD WITHOUT CONTRAST TECHNIQUE: Contiguous axial images were obtained from the base of the skull through the vertex without intravenous contrast. COMPARISON:  None. FINDINGS: Brain: Cerebral volume is within normal limits for age. No midline shift, ventriculomegaly, mass effect, evidence of mass lesion, intracranial hemorrhage or evidence of cortically based acute infarction. Gray-white matter differentiation is within normal limits throughout the brain. Vascular: Calcified atherosclerosis at the skull base. No suspicious intracranial vascular hyperdensity. Skull: No fracture identified.  Normal bone mineralization. Sinuses/Orbits: Minimal mucosal thickening and bubbly opacity in the left sphenoid sinus. Other visualized paranasal sinuses and mastoids are well pneumatized. Other: Left posterior convexity mild scalp hematoma (series 4, image 41). Underlying calvarium intact. No other orbit or scalp soft tissue injury identified. IMPRESSION: 1. Left posterior convexity scalp hematoma without underlying skull fracture. 2.  Normal for age non contrast CT appearance of the brain. Electronically Signed   By: Genevie Ann M.D.   On: 09/12/2019 16:24   CT Angio Chest PE W/Cm &/Or Wo Cm  Result Date: 09/12/2019 CLINICAL DATA:  69 year old female status post fall, trauma. Dizziness and shortness of breath. Negative for COVID-19 today. EXAM: CT ANGIOGRAPHY CHEST WITH CONTRAST TECHNIQUE: Multidetector CT imaging of the chest was  performed using the standard protocol during bolus administration of intravenous contrast. Multiplanar CT image reconstructions and MIPs were obtained to evaluate the vascular anatomy. CONTRAST:  65m OMNIPAQUE IOHEXOL 350 MG/ML SOLN COMPARISON:  Cervical spine CT today reported separately. FINDINGS: Cardiovascular: Good contrast bolus timing in the pulmonary arterial tree. Positive pulmonary emboli including saddle embolus (series 8, image 67). Thrombus in both main pulmonary arteries, and involving all bilateral lobar branches. Abnormal RV to LV ratio (series 6, image 185) compatible with right heart strain. There is a small superimposed pericardial effusion. Negative visible aorta aside from mild atherosclerosis. Faint calcified coronary artery atherosclerosis (series 6, image 147). Mediastinum/Nodes: Negative.  No lymphadenopathy. Lungs/Pleura: Trace bilateral layering pleural effusions. Major airways are patent. Mild dependent and perihilar atelectasis. No confluent pulmonary infarct at this time. Upper Abdomen: Negative visible liver, gallbladder, pancreas, adrenal glands, left kidney and bowel. Heterogeneous enhancement of the spleen which can be seen with early contrast timing due to red pulp/white pulp perfusion differences. However, the confluent hypodensity seen on series 5, image 101 is atypical. Still, there is no perisplenic inflammation or fluid evident. Musculoskeletal: No acute or suspicious osseous lesion. Review of the MIP images confirms the above findings. IMPRESSION: 1. Positive for Acute PE with saddle embolus, a large clot burden, and CT evidence of right heart strain. 2. Small pericardial effusion and pleural effusions. No pulmonary infarct at this time. 3. Heterogeneous enhancement of the spleen, somewhat atypical for the physiologic early splenic enhancement. However, the absence of perisplenic inflammation or fluid argues against acute splenic infarcts. Critical Value/emergent results  were called by telephone at the time of interpretation on 09/12/2019 at 4:33 pm to PAyrwho verbally acknowledged these results. Electronically Signed   By: HGenevie AnnM.D.   On: 09/12/2019 16:46   CT Cervical Spine Wo Contrast  Result Date: 09/12/2019 CLINICAL DATA:  69year old female status post fall, trauma. Dizziness and shortness of breath. EXAM: CT CERVICAL SPINE WITHOUT CONTRAST TECHNIQUE: Multidetector CT imaging of the cervical spine was performed without intravenous contrast. Multiplanar CT image reconstructions were also generated. COMPARISON:  Head CT today reported separately. FINDINGS: Alignment: Straightening of cervical lordosis. Mild degenerative appearing retrolisthesis of C6 on C7. Cervicothoracic junction alignment is within normal limits. Bilateral posterior element alignment is within normal limits. Skull base and vertebrae: Visualized skull base is intact. No atlanto-occipital dissociation. No acute osseous abnormality identified. Soft tissues and spinal canal: No prevertebral fluid or swelling. No visible canal hematoma. Surgical clips about the left thyroid compatible with prior surgery. Otherwise negative noncontrast visible neck soft tissues. Disc levels: Widespread right side cervical facet arthropathy, lower cervical disc and endplate degeneration. 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 Subsequently, mild spinal stenosis is suspected at C4-C5. Upper chest: Visible upper thoracic levels appear intact. Calcified aortic atherosclerosis. Negative visible lung apices. IMPRESSION: 1. No acute traumatic injury identified in the cervical spine. 2. 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 Mild degenerative spinal stenosis suspected. Electronically Signed   By:  Genevie Ann M.D.   On: 09/12/2019 16:28   IR Angiogram Pulmonary Bilateral Selective  Result Date:  09/13/2019 INDICATION: Sub massive pulmonary embolism. Patient presents for initiation of bilateral pulmonary arterial catheter directed thrombolysis. EXAM: 1. ULTRASOUND GUIDANCE FOR VENOUS ACCESS X2 2. PULMONARY ARTERIOGRAPHY 3. FLUOROSCOPIC GUIDED PLACEMENT OF BILATERAL PULMONARY ARTERIAL LYTIC INFUSION CATHETERS COMPARISON: Chest CTA-earlier same day MEDICATIONS: None ANESTHESIA/SEDATION: Moderate (conscious) sedation was employed during this procedure. A total of Versed 1 mg and Fentanyl 100 mcg was administered intravenously. Moderate Sedation Time: 35 minutes. The patient's level of consciousness and vital signs were monitored continuously by radiology nursing throughout the procedure under my direct supervision. CONTRAST: 69m OMNIPAQUE IOHEXOL 300 MG/ML SOLN FLUOROSCOPY TIME: 9 minutes, 12 seconds (96 mGy) COMPLICATIONS: None immediate. TECHNIQUE: Informed written consent was obtained from the patient after a discussion of the risks, benefits and alternatives to treatment. Questions regarding the procedure were encouraged and answered. A timeout was performed prior to the initiation of the procedure. Ultrasound scanning was performed of the right groin and demonstrated wide patency of the right common femoral vein. As such, the right common femoral vein was selected venous access. The right groin was prepped and draped in the usual sterile fashion, and a sterile drape was applied covering the operative field. Maximum barrier sterile technique with sterile gowns and gloves were used for the procedure. A timeout was performed prior to the initiation of the procedure. Local anesthesia was provided with 1% lidocaine. Under direct ultrasound guidance, the right common femoral vein was accessed with a micro puncture kit ultimately allowing placement of a 6 French, 35 cm vascular sheath. Slightly cranial to this initial access, the right common femoral was again accessed with an additional micropuncture kit  ultimately allowing placement of an additional 6 French, 35 cm vascular sheath. Ultrasound images were saved for procedural documentation purposes. With the use of a glidewire, a vertebral catheter was advanced into the main pulmonary artery and a limited central pulmonary arteriogram was performed. Pressure measurements were then obtained from the main pulmonary artery. The vertebral catheter was advanced into the distal branch of the right lower lobe pulmonary artery. Limited contrast injection confirmed appropriate positioning. Over an exchange length roadrunner wire, the vertebral catheter was exchanged for a 90/15 cm multi side-hole infusion catheter. With the use of a glidewire, a vertebral catheter was advanced into a distal branch of the left lower lobe pulmonary artery. Limited contrast injection confirmed appropriate positioning. Over an exchange length roadrunner wire, the pigtail catheter was exchanged for a 90/10 cm multi side-hole EKOS ultrasound assisted infusion catheter. A postprocedural fluoroscopic image was obtained to document final catheter positioning. The external catheter tubing was secured at the right thigh and the lytic therapy was initiated. The patient tolerated the procedure well without immediate postprocedural complication. FINDINGS: Limited central pulmonary arteriogram demonstrates enlargement of the main, right and left pulmonary arteries with near occlusive filling defect seen bilaterally compatible with findings seen on preceding chest CTA. Acquired pressure measurements: Main pulmonary artery-61/32; mean-44 (normal: < 25/10) Following the procedure, both ultrasound assisted infusion catheter tips terminate within the distal aspects of the bilateral lower lobe sub segmental pulmonary arteries. IMPRESSION: 1. Successful fluoroscopic guided initiation of bilateral ultrasound assisted catheter directed pulmonary arterial lysis for sub massive pulmonary embolism and right-sided  heart strain. 2. Elevated pressure measurements within the main pulmonary artery compatible with critical pulmonary arterial hypertension. PLAN: - continued monitoring and management per the ICU staff. - morning chest radiograph with  bedside pulmonary arterial pressure measurements and catheter removal following 12 hour lytic infusion. Electronically Signed By: Sandi Mariscal M.D. On: 09/13/2019 08:42  IR Angiogram Selective Each Additional Vessel  Result Date: 09/13/2019 INDICATION: Sub massive pulmonary embolism. Patient presents for initiation of bilateral pulmonary arterial catheter directed thrombolysis. EXAM: 1. ULTRASOUND GUIDANCE FOR VENOUS ACCESS X2 2. PULMONARY ARTERIOGRAPHY 3. FLUOROSCOPIC GUIDED PLACEMENT OF BILATERAL PULMONARY ARTERIAL LYTIC INFUSION CATHETERS COMPARISON: Chest CTA-earlier same day MEDICATIONS: None ANESTHESIA/SEDATION: Moderate (conscious) sedation was employed during this procedure. A total of Versed 1 mg and Fentanyl 100 mcg was administered intravenously. Moderate Sedation Time: 35 minutes. The patient's level of consciousness and vital signs were monitored continuously by radiology nursing throughout the procedure under my direct supervision. CONTRAST: 56m OMNIPAQUE IOHEXOL 300 MG/ML SOLN FLUOROSCOPY TIME: 9 minutes, 12 seconds (96 mGy) COMPLICATIONS: None immediate. TECHNIQUE: Informed written consent was obtained from the patient after a discussion of the risks, benefits and alternatives to treatment. Questions regarding the procedure were encouraged and answered. A timeout was performed prior to the initiation of the procedure. Ultrasound scanning was performed of the right groin and demonstrated wide patency of the right common femoral vein. As such, the right common femoral vein was selected venous access. The right groin was prepped and draped in the usual sterile fashion, and a sterile drape was applied covering the operative field. Maximum barrier sterile technique with  sterile gowns and gloves were used for the procedure. A timeout was performed prior to the initiation of the procedure. Local anesthesia was provided with 1% lidocaine. Under direct ultrasound guidance, the right common femoral vein was accessed with a micro puncture kit ultimately allowing placement of a 6 French, 35 cm vascular sheath. Slightly cranial to this initial access, the right common femoral was again accessed with an additional micropuncture kit ultimately allowing placement of an additional 6 French, 35 cm vascular sheath. Ultrasound images were saved for procedural documentation purposes. With the use of a glidewire, a vertebral catheter was advanced into the main pulmonary artery and a limited central pulmonary arteriogram was performed. Pressure measurements were then obtained from the main pulmonary artery. The vertebral catheter was advanced into the distal branch of the right lower lobe pulmonary artery. Limited contrast injection confirmed appropriate positioning. Over an exchange length roadrunner wire, the vertebral catheter was exchanged for a 90/15 cm multi side-hole infusion catheter. With the use of a glidewire, a vertebral catheter was advanced into a distal branch of the left lower lobe pulmonary artery. Limited contrast injection confirmed appropriate positioning. Over an exchange length roadrunner wire, the pigtail catheter was exchanged for a 90/10 cm multi side-hole EKOS ultrasound assisted infusion catheter. A postprocedural fluoroscopic image was obtained to document final catheter positioning. The external catheter tubing was secured at the right thigh and the lytic therapy was initiated. The patient tolerated the procedure well without immediate postprocedural complication. FINDINGS: Limited central pulmonary arteriogram demonstrates enlargement of the main, right and left pulmonary arteries with near occlusive filling defect seen bilaterally compatible with findings seen on  preceding chest CTA. Acquired pressure measurements: Main pulmonary artery-61/32; mean-44 (normal: < 25/10) Following the procedure, both ultrasound assisted infusion catheter tips terminate within the distal aspects of the bilateral lower lobe sub segmental pulmonary arteries. IMPRESSION: 1. Successful fluoroscopic guided initiation of bilateral ultrasound assisted catheter directed pulmonary arterial lysis for sub massive pulmonary embolism and right-sided heart strain. 2. Elevated pressure measurements within the main pulmonary artery compatible with critical pulmonary arterial hypertension. PLAN: - continued  monitoring and management per the ICU staff. - morning chest radiograph with bedside pulmonary arterial pressure measurements and catheter removal following 12 hour lytic infusion. Electronically Signed By: Sandi Mariscal M.D. On: 09/13/2019 08:42  IR Angiogram Selective Each Additional Vessel  Result Date: 09/13/2019 INDICATION: Sub massive pulmonary embolism. Patient presents for initiation of bilateral pulmonary arterial catheter directed thrombolysis. EXAM: 1. ULTRASOUND GUIDANCE FOR VENOUS ACCESS X2 2. PULMONARY ARTERIOGRAPHY 3. FLUOROSCOPIC GUIDED PLACEMENT OF BILATERAL PULMONARY ARTERIAL LYTIC INFUSION CATHETERS COMPARISON: Chest CTA-earlier same day MEDICATIONS: None ANESTHESIA/SEDATION: Moderate (conscious) sedation was employed during this procedure. A total of Versed 1 mg and Fentanyl 100 mcg was administered intravenously. Moderate Sedation Time: 35 minutes. The patient's level of consciousness and vital signs were monitored continuously by radiology nursing throughout the procedure under my direct supervision. CONTRAST: 34m OMNIPAQUE IOHEXOL 300 MG/ML SOLN FLUOROSCOPY TIME: 9 minutes, 12 seconds (96 mGy) COMPLICATIONS: None immediate. TECHNIQUE: Informed written consent was obtained from the patient after a discussion of the risks, benefits and alternatives to treatment. Questions regarding the  procedure were encouraged and answered. A timeout was performed prior to the initiation of the procedure. Ultrasound scanning was performed of the right groin and demonstrated wide patency of the right common femoral vein. As such, the right common femoral vein was selected venous access. The right groin was prepped and draped in the usual sterile fashion, and a sterile drape was applied covering the operative field. Maximum barrier sterile technique with sterile gowns and gloves were used for the procedure. A timeout was performed prior to the initiation of the procedure. Local anesthesia was provided with 1% lidocaine. Under direct ultrasound guidance, the right common femoral vein was accessed with a micro puncture kit ultimately allowing placement of a 6 French, 35 cm vascular sheath. Slightly cranial to this initial access, the right common femoral was again accessed with an additional micropuncture kit ultimately allowing placement of an additional 6 French, 35 cm vascular sheath. Ultrasound images were saved for procedural documentation purposes. With the use of a glidewire, a vertebral catheter was advanced into the main pulmonary artery and a limited central pulmonary arteriogram was performed. Pressure measurements were then obtained from the main pulmonary artery. The vertebral catheter was advanced into the distal branch of the right lower lobe pulmonary artery. Limited contrast injection confirmed appropriate positioning. Over an exchange length roadrunner wire, the vertebral catheter was exchanged for a 90/15 cm multi side-hole infusion catheter. With the use of a glidewire, a vertebral catheter was advanced into a distal branch of the left lower lobe pulmonary artery. Limited contrast injection confirmed appropriate positioning. Over an exchange length roadrunner wire, the pigtail catheter was exchanged for a 90/10 cm multi side-hole EKOS ultrasound assisted infusion catheter. A postprocedural  fluoroscopic image was obtained to document final catheter positioning. The external catheter tubing was secured at the right thigh and the lytic therapy was initiated. The patient tolerated the procedure well without immediate postprocedural complication. FINDINGS: Limited central pulmonary arteriogram demonstrates enlargement of the main, right and left pulmonary arteries with near occlusive filling defect seen bilaterally compatible with findings seen on preceding chest CTA. Acquired pressure measurements: Main pulmonary artery-61/32; mean-44 (normal: < 25/10) Following the procedure, both ultrasound assisted infusion catheter tips terminate within the distal aspects of the bilateral lower lobe sub segmental pulmonary arteries. IMPRESSION: 1. Successful fluoroscopic guided initiation of bilateral ultrasound assisted catheter directed pulmonary arterial lysis for sub massive pulmonary embolism and right-sided heart strain. 2. Elevated pressure measurements within the  main pulmonary artery compatible with critical pulmonary arterial hypertension. PLAN: - continued monitoring and management per the ICU staff. - morning chest radiograph with bedside pulmonary arterial pressure measurements and catheter removal following 12 hour lytic infusion. Electronically Signed By: Sandi Mariscal M.D. On: 09/13/2019 08:42  IR US Guide Vasc Access Right  Result Date: 09/13/2019 INDICATION: Sub massive pulmonary embolism. Patient presents for initiation of bilateral pulmonary arterial catheter directed thrombolysis. EXAM: 1. ULTRASOUND GUIDANCE FOR VENOUS ACCESS X2 2. PULMONARY ARTERIOGRAPHY 3. FLUOROSCOPIC GUIDED PLACEMENT OF BILATERAL PULMONARY ARTERIAL LYTIC INFUSION CATHETERS COMPARISON: Chest CTA-earlier same day MEDICATIONS: None ANESTHESIA/SEDATION: Moderate (conscious) sedation was employed during this procedure. A total of Versed 1 mg and Fentanyl 100 mcg was administered intravenously. Moderate Sedation Time: 35 minutes.  The patient's level of consciousness and vital signs were monitored continuously by radiology nursing throughout the procedure under my direct supervision. CONTRAST: 52m OMNIPAQUE IOHEXOL 300 MG/ML SOLN FLUOROSCOPY TIME: 9 minutes, 12 seconds (96 mGy) COMPLICATIONS: None immediate. TECHNIQUE: Informed written consent was obtained from the patient after a discussion of the risks, benefits and alternatives to treatment. Questions regarding the procedure were encouraged and answered. A timeout was performed prior to the initiation of the procedure. Ultrasound scanning was performed of the right groin and demonstrated wide patency of the right common femoral vein. As such, the right common femoral vein was selected venous access. The right groin was prepped and draped in the usual sterile fashion, and a sterile drape was applied covering the operative field. Maximum barrier sterile technique with sterile gowns and gloves were used for the procedure. A timeout was performed prior to the initiation of the procedure. Local anesthesia was provided with 1% lidocaine. Under direct ultrasound guidance, the right common femoral vein was accessed with a micro puncture kit ultimately allowing placement of a 6 French, 35 cm vascular sheath. Slightly cranial to this initial access, the right common femoral was again accessed with an additional micropuncture kit ultimately allowing placement of an additional 6 French, 35 cm vascular sheath. Ultrasound images were saved for procedural documentation purposes. With the use of a glidewire, a vertebral catheter was advanced into the main pulmonary artery and a limited central pulmonary arteriogram was performed. Pressure measurements were then obtained from the main pulmonary artery. The vertebral catheter was advanced into the distal branch of the right lower lobe pulmonary artery. Limited contrast injection confirmed appropriate positioning. Over an exchange length roadrunner wire,  the vertebral catheter was exchanged for a 90/15 cm multi side-hole infusion catheter. With the use of a glidewire, a vertebral catheter was advanced into a distal branch of the left lower lobe pulmonary artery. Limited contrast injection confirmed appropriate positioning. Over an exchange length roadrunner wire, the pigtail catheter was exchanged for a 90/10 cm multi side-hole EKOS ultrasound assisted infusion catheter. A postprocedural fluoroscopic image was obtained to document final catheter positioning. The external catheter tubing was secured at the right thigh and the lytic therapy was initiated. The patient tolerated the procedure well without immediate postprocedural complication. FINDINGS: Limited central pulmonary arteriogram demonstrates enlargement of the main, right and left pulmonary arteries with near occlusive filling defect seen bilaterally compatible with findings seen on preceding chest CTA. Acquired pressure measurements: Main pulmonary artery-61/32; mean-44 (normal: < 25/10) Following the procedure, both ultrasound assisted infusion catheter tips terminate within the distal aspects of the bilateral lower lobe sub segmental pulmonary arteries. IMPRESSION: 1. Successful fluoroscopic guided initiation of bilateral ultrasound assisted catheter directed pulmonary arterial lysis for sub massive  pulmonary embolism and right-sided heart strain. 2. Elevated pressure measurements within the main pulmonary artery compatible with critical pulmonary arterial hypertension. PLAN: - continued monitoring and management per the ICU staff. - morning chest radiograph with bedside pulmonary arterial pressure measurements and catheter removal following 12 hour lytic infusion. Electronically Signed By: Sandi Mariscal M.D. On: 09/13/2019 08:42  DG Chest Port 1 View  Result Date: 09/13/2019 CLINICAL DATA:  Pulmonary artery catheters EXAM: PORTABLE CHEST 1 VIEW COMPARISON:  Yesterday FINDINGS: Bilateral pulmonary artery  infusion catheters. The right-sided catheter tip is 3 cm higher than yesterday. The left-sided catheter is unchanged. No visible adjacent pulmonary hemorrhage. Chronic cardiomegaly. Mild streaky opacity asymmetric to the left. Postoperative thoracic inlet. IMPRESSION: Mild shortening of the right-sided pulmonary artery catheter. Electronically Signed   By: Monte Fantasia M.D.   On: 09/13/2019 08:48   DG Chest Port 1 View  Result Date: 09/12/2019 CLINICAL DATA:  Evaluate pulmonary catheter placement EXAM: PORTABLE CHEST 1 VIEW COMPARISON:  Film from earlier in the same day. FINDINGS: Cardiac shadow is stable. The lungs are clear bilaterally. Infusion catheters are now noted in the pulmonary arteries bilaterally. No sizable effusion or infiltrate is seen. Postsurgical changes in the left neck are noted. IMPRESSION: Pulmonary arterial infusion catheters bilaterally. No acute abnormality noted. Electronically Signed   By: Inez Catalina M.D.   On: 09/12/2019 20:36   DG Chest Port 1 View  Result Date: 09/12/2019 CLINICAL DATA:  Shortness of breath. EXAM: PORTABLE CHEST 1 VIEW COMPARISON:  None. FINDINGS: The heart size and mediastinal contours are within normal limits. Both lungs are clear. No pneumothorax or pleural effusion is noted. The visualized skeletal structures are unremarkable. IMPRESSION: No active disease. Electronically Signed   By: Marijo Conception M.D.   On: 09/12/2019 14:43   ECHOCARDIOGRAM COMPLETE  Result Date: 09/13/2019    ECHOCARDIOGRAM REPORT   Patient Name:   Julia Gomez Date of Exam: 09/13/2019 Medical Rec #:  [id number]     Height:       66.5 in Accession #:    [id number]    Weight:       227.7 lb Date of Birth:  [birth year]     BSA:          2.125 m Patient Age:    7 years      BP:           149/85 mmHg Patient Gender: F             HR:           102 bpm. Exam Location:  Inpatient Procedure: 2D Echo, Cardiac Doppler and Color Doppler STAT ECHO Indications:    Pulmonary Embolus 415.19 /  I26.99  History:        Patient has no prior history of Echocardiogram examinations.                 Risk Factors:Non-Smoker.  Sonographer:    Vickie Epley RDCS Referring Phys: [place]  1. Positive McConnell's sign is present. Acute RV failure consistent with submassive PE is present. Right ventricular systolic function is moderately reduced. The right ventricular size is severely enlarged. There is normal pulmonary artery systolic pressure. The estimated right ventricular systolic pressure is 72.0 mmHg.  2. Left ventricular ejection fraction, by estimation, is 60 to 65%. The left ventricle has normal function. The left ventricle has no regional wall motion abnormalities. Left ventricular diastolic parameters were normal. There is the interventricular septum  is flattened in systole and diastole, consistent with right ventricular pressure and volume overload.  3. Left atrial size was mildly dilated.  4. Moderate pericardial effusion. The pericardial effusion is posterior to the left ventricle. There is no evidence of cardiac tamponade.  5. The mitral valve is grossly normal. Trivial mitral valve regurgitation. No evidence of mitral stenosis.  6. The aortic valve is tricuspid. Aortic valve regurgitation is not visualized. Mild aortic valve sclerosis is present, with no evidence of aortic valve stenosis.  7. The inferior vena cava is normal in size with <50% respiratory variability, suggesting right atrial pressure of 8 mmHg. FINDINGS  Left Ventricle: Left ventricular ejection fraction, by estimation, is 60 to 65%. The left ventricle has normal function. The left ventricle has no regional wall motion abnormalities. The left ventricular internal cavity size was normal in size. There is  no left ventricular hypertrophy. The interventricular septum is flattened in systole and diastole, consistent with right ventricular pressure and volume overload. Left ventricular diastolic parameters were  normal. Right Ventricle: Positive McConnell's sign is present. Acute RV failure consistent with submassive PE is present. The right ventricular size is severely enlarged. No increase in right ventricular wall thickness. Right ventricular systolic function is moderately reduced. There is normal pulmonary artery systolic pressure. The tricuspid regurgitant velocity is 2.62 m/s, and with an assumed right atrial pressure of 8 mmHg, the estimated right ventricular systolic pressure is 77.4 mmHg. Left Atrium: Left atrial size was mildly dilated. Right Atrium: Right atrial size was normal in size. Pericardium: A moderately sized pericardial effusion is present. The pericardial effusion is posterior to the left ventricle. There is no evidence of cardiac tamponade. Presence of pericardial fat pad. Mitral Valve: The mitral valve is grossly normal. Trivial mitral valve regurgitation. No evidence of mitral valve stenosis. Tricuspid Valve: The tricuspid valve is grossly normal. Tricuspid valve regurgitation is mild . No evidence of tricuspid stenosis. Aortic Valve: The aortic valve is tricuspid. . There is mild thickening and mild calcification of the aortic valve. Aortic valve regurgitation is not visualized. Mild aortic valve sclerosis is present, with no evidence of aortic valve stenosis. There is mild thickening of the aortic valve. There is mild calcification of the aortic valve. Pulmonic Valve: The pulmonic valve was grossly normal. Pulmonic valve regurgitation is not visualized. No evidence of pulmonic stenosis. Aorta: The aortic root is normal in size and structure. Venous: The inferior vena cava is normal in size with less than 50% respiratory variability, suggesting right atrial pressure of 8 mmHg. IAS/Shunts: The atrial septum is grossly normal. Additional Comments: A venous catheter is visualized in the inferior vena cava, right atrium and right ventricle.  LEFT VENTRICLE PLAX 2D LVIDd:         4.10 cm     Diastology  LVIDs:         2.90 cm     LV e' lateral: 9.46 cm/s LV PW:         0.70 cm     LV e' medial:  9.46 cm/s LV IVS:        0.80 cm LVOT diam:     1.90 cm LV SV:         47 LV SV Index:   22 LVOT Area:     2.84 cm  LV Volumes (MOD) LV vol d, MOD A2C: 54.8 ml LV vol d, MOD A4C: 52.0 ml LV vol s, MOD A2C: 21.9 ml LV vol s, MOD A4C: 30.8 ml LV  SV MOD A2C:     32.9 ml LV SV MOD A4C:     52.0 ml LV SV MOD BP:      27.9 ml RIGHT VENTRICLE TAPSE (M-mode): 1.7 cm LEFT ATRIUM             Index      RIGHT ATRIUM           Index LA diam:        3.20 cm 1.51 cm/m RA Area:     17.20 cm LA Vol (A2C):   21.0 ml 9.88 ml/m RA Volume:   48.30 ml  22.73 ml/m LA Vol (A4C):   20.3 ml 9.55 ml/m LA Biplane Vol: 20.8 ml 9.79 ml/m  AORTIC VALVE LVOT Vmax:   96.80 cm/s LVOT Vmean:  67.900 cm/s LVOT VTI:    0.165 m  AORTA Ao Root diam: 2.60 cm TRICUSPID VALVE TR Peak grad:   27.5 mmHg TR Vmax:        262.00 cm/s  SHUNTS Systemic VTI:  0.16 m Systemic Diam: 1.90 cm Eleonore Chiquito MD Electronically signed by Eleonore Chiquito MD Signature Date/Time: 09/13/2019/10:44:57 AM    Final    VAS [place] LOWER EXTREMITY VENOUS (DVT)  Result Date: 09/13/2019  Lower Venous DVTStudy Indications: Pulmonary embolism.  Performing Technologist: Abram Sander RVS  Examination Guidelines: A complete evaluation includes B-mode imaging, spectral Doppler, color Doppler, and power Doppler as needed of all accessible portions of each vessel. Bilateral testing is considered an integral part of a complete examination. Limited examinations for reoccurring indications may be performed as noted. The reflux portion of the exam is performed with the patient in reverse Trendelenburg.  +---------+---------------+---------+-----------+----------+--------------+ RIGHT    CompressibilityPhasicitySpontaneityPropertiesThrombus Aging +---------+---------------+---------+-----------+----------+--------------+ CFV      Full           Yes      Yes                                  +---------+---------------+---------+-----------+----------+--------------+ SFJ      Full                                                        +---------+---------------+---------+-----------+----------+--------------+ FV Prox  Full                                                        +---------+---------------+---------+-----------+----------+--------------+ FV Mid   Full                                                        +---------+---------------+---------+-----------+----------+--------------+ FV DistalFull                                                        +---------+---------------+---------+-----------+----------+--------------+ PFV  Full                                                        +---------+---------------+---------+-----------+----------+--------------+ POP      Full           Yes      Yes                                 +---------+---------------+---------+-----------+----------+--------------+ PTV      Full                                                        +---------+---------------+---------+-----------+----------+--------------+ PERO     Full                                                        +---------+---------------+---------+-----------+----------+--------------+   +---------+---------------+---------+-----------+----------+--------------+ LEFT     CompressibilityPhasicitySpontaneityPropertiesThrombus Aging +---------+---------------+---------+-----------+----------+--------------+ CFV      Full           Yes      Yes                                 +---------+---------------+---------+-----------+----------+--------------+ SFJ      Full                                                        +---------+---------------+---------+-----------+----------+--------------+ FV Prox  Full                                                         +---------+---------------+---------+-----------+----------+--------------+ FV Mid   Full                                                        +---------+---------------+---------+-----------+----------+--------------+ FV DistalFull                                                        +---------+---------------+---------+-----------+----------+--------------+ PFV      Full                                                        +---------+---------------+---------+-----------+----------+--------------+  POP      None           No       No                   Acute          +---------+---------------+---------+-----------+----------+--------------+ PTV      None                                         Acute          +---------+---------------+---------+-----------+----------+--------------+ PERO     None                                         Acute          +---------+---------------+---------+-----------+----------+--------------+     Summary: RIGHT: - No evidence of deep vein thrombosis in the lower extremity. No indirect evidence of obstruction proximal to the inguinal ligament.  LEFT: - Findings consistent with acute deep vein thrombosis involving the left popliteal vein, left posterior tibial veins, and left peroneal veins. - No cystic structure found in the popliteal fossa.  *See table(s) above for measurements and observations.    Preliminary    IR INFUSION THROMBOL ARTERIAL INITIAL (MS)  Result Date: 09/13/2019 INDICATION: Sub massive pulmonary embolism. Patient presents for initiation of bilateral pulmonary arterial catheter directed thrombolysis. EXAM: 1. ULTRASOUND GUIDANCE FOR VENOUS ACCESS X2 2. PULMONARY ARTERIOGRAPHY 3. FLUOROSCOPIC GUIDED PLACEMENT OF BILATERAL PULMONARY ARTERIAL LYTIC INFUSION CATHETERS COMPARISON: Chest CTA-earlier same day MEDICATIONS: None ANESTHESIA/SEDATION: Moderate (conscious) sedation was employed during this procedure. A total  of Versed 1 mg and Fentanyl 100 mcg was administered intravenously. Moderate Sedation Time: 35 minutes. The patient's level of consciousness and vital signs were monitored continuously by radiology nursing throughout the procedure under my direct supervision. CONTRAST: 32m OMNIPAQUE IOHEXOL 300 MG/ML SOLN FLUOROSCOPY TIME: 9 minutes, 12 seconds (96 mGy) COMPLICATIONS: None immediate. TECHNIQUE: Informed written consent was obtained from the patient after a discussion of the risks, benefits and alternatives to treatment. Questions regarding the procedure were encouraged and answered. A timeout was performed prior to the initiation of the procedure. Ultrasound scanning was performed of the right groin and demonstrated wide patency of the right common femoral vein. As such, the right common femoral vein was selected venous access. The right groin was prepped and draped in the usual sterile fashion, and a sterile drape was applied covering the operative field. Maximum barrier sterile technique with sterile gowns and gloves were used for the procedure. A timeout was performed prior to the initiation of the procedure. Local anesthesia was provided with 1% lidocaine. Under direct ultrasound guidance, the right common femoral vein was accessed with a micro puncture kit ultimately allowing placement of a 6 French, 35 cm vascular sheath. Slightly cranial to this initial access, the right common femoral was again accessed with an additional micropuncture kit ultimately allowing placement of an additional 6 French, 35 cm vascular sheath. Ultrasound images were saved for procedural documentation purposes. With the use of a glidewire, a vertebral catheter was advanced into the main pulmonary artery and a limited central pulmonary arteriogram was performed. Pressure measurements were then obtained from the main pulmonary artery. The vertebral catheter was advanced into the distal branch of the right lower lobe  pulmonary artery.  Limited contrast injection confirmed appropriate positioning. Over an exchange length roadrunner wire, the vertebral catheter was exchanged for a 90/15 cm multi side-hole infusion catheter. With the use of a glidewire, a vertebral catheter was advanced into a distal branch of the left lower lobe pulmonary artery. Limited contrast injection confirmed appropriate positioning. Over an exchange length roadrunner wire, the pigtail catheter was exchanged for a 90/10 cm multi side-hole EKOS ultrasound assisted infusion catheter. A postprocedural fluoroscopic image was obtained to document final catheter positioning. The external catheter tubing was secured at the right thigh and the lytic therapy was initiated. The patient tolerated the procedure well without immediate postprocedural complication. FINDINGS: Limited central pulmonary arteriogram demonstrates enlargement of the main, right and left pulmonary arteries with near occlusive filling defect seen bilaterally compatible with findings seen on preceding chest CTA. Acquired pressure measurements: Main pulmonary artery-61/32; mean-44 (normal: < 25/10) Following the procedure, both ultrasound assisted infusion catheter tips terminate within the distal aspects of the bilateral lower lobe sub segmental pulmonary arteries. IMPRESSION: 1. Successful fluoroscopic guided initiation of bilateral ultrasound assisted catheter directed pulmonary arterial lysis for sub massive pulmonary embolism and right-sided heart strain. 2. Elevated pressure measurements within the main pulmonary artery compatible with critical pulmonary arterial hypertension. PLAN: - continued monitoring and management per the ICU staff. - morning chest radiograph with bedside pulmonary arterial pressure measurements and catheter removal following 12 hour lytic infusion. Electronically Signed By: Sandi Mariscal M.D. On: 09/13/2019 08:42  IR THROMB F/U EVAL ART/VEN FINAL DAY (MS)  Result Date:  09/13/2019 INDICATION: 69 year old female with a history of submassive pulmonary embolism, status post catheter directed lysis EXAM: FOLLOW-UP PULMONARY ARTERY EMBOLISM LYSIS COMPARISON:  CT 09/12/2019, initial angiogram 09/12/2019 MEDICATIONS: None. ANESTHESIA/SEDATION: None FLUOROSCOPY TIME:  None COMPLICATIONS: None TECHNIQUE: Informed written consent was obtained from the patient after a thorough discussion of the procedural risks, benefits and alternatives. All questions were addressed. Maximal Sterile Barrier Technique was utilized including caps, mask, sterile gowns, sterile gloves, sterile drape, hand hygiene and skin antiseptic. A timeout was performed prior to the initiation of the procedure. Clean procedure was used to do bedside trans duction of the right pulmonary artery lytic catheter. Once the pressure measurement was recorded, all catheters and sheaths were removed. Manual pressure was used for hemostasis and clean bandages were placed. Patient tolerated the procedure well and remained hemodynamically stable throughout. No subsequent blood loss. FINDINGS: Initial pressure measurement on yesterday's study main pulmonary artery: 61/32 (44) Follow-up pressure on today's study main pulmonary artery: 30/12 (19) IMPRESSION: Follow-up pulmonary artery lysis demonstrates reduction in the main pulmonary artery measured pressures, status post bilateral pulmonary artery catheter directed lysis. Signed, Dulcy Fanny. Dellia Nims, RPVI [facility] Electronically Signed   By: Corrie Mckusick D.O.   On: 09/13/2019 11:59    Labs:  CBC: Recent Labs    09/12/19 1256 09/12/19 2301 09/13/19 0036 09/13/19 0517  WBC 10.1 9.6 10.1 9.6  HGB 13.3 12.1 12.5 11.5*  HCT 43.0 38.5 38.6 37.1  PLT 209 144* 156 106*    COAGS: Recent Labs    09/12/19 1755  INR 1.1  APTT 27    BMP: Recent Labs    09/12/19 1256 09/13/19 0517  NA 141 138  K 4.4 5.1  CL  108 110  CO2 22 22  GLUCOSE 147* 122*  BUN 15 13  CALCIUM 8.1* 7.1*  CREATININE 1.34* 0.86  GFRNONAA 41* >60  GFRAA 47* >60  LIVER FUNCTION TESTS: No results for input(s): BILITOT, AST, ALT, ALKPHOS, PROT, ALBUMIN in the last 8760 hours.  Assessment and Plan:  Submassive PE, s/p bilateral catheter directed pulmonary arterial thrombolysis by Dr. Pascal Lux 09/12/19. Hemoglobin stable. Sheaths were pulled at approximately 10:15. Pressures were checked with a main pulmonary artery pressure of 30/12 (19) compared with yesterday's pre-procedure pressure of 61/32 (44). Sheaths pulled by IR tech. Per IR tech, Pressure held until hemostasis was achieved. Site covered with quick clot, tegaderm and gauze. Patient has 6 hour bedrest.    IR will follow up tomorrow to re-check groin site. Please call IR with any questions.      Electronically Signed: Soyla Dryer, AGACNP-BC [phone] 09/13/2019, 12:48 PM   I spent a total of 25 Minutes at the the patient's bedside AND on the patient's hospital floor or unit, greater than 50% of which was counseling/coordinating care for catheter directed pulmonary arterial thrombolysis.

## 2019-09-13 NOTE — Progress Notes (Signed)
Pt is to remain on bed rest with R. Leg straight until 1615 per Asher Muir, NP in IR.

## 2019-09-13 NOTE — Progress Notes (Signed)
CRITICAL VALUE ALERT  Critical Value:  Troponin 360  Date & Time Notied:  09/13/2019 0019  Provider Notified: Pola Corn  Orders Received/Actions taken: None at this time

## 2019-09-14 DIAGNOSIS — I82432 Acute embolism and thrombosis of left popliteal vein: Secondary | ICD-10-CM

## 2019-09-14 DIAGNOSIS — N179 Acute kidney failure, unspecified: Secondary | ICD-10-CM

## 2019-09-14 LAB — CBC
HCT: 32.3 % — ABNORMAL LOW (ref 36.0–46.0)
HCT: 30.6 % — ABNORMAL LOW (ref 36.0–46.0)
Hemoglobin: 10.2 g/dL — ABNORMAL LOW (ref 12.0–15.0)
Hemoglobin: 9.7 g/dL — ABNORMAL LOW (ref 12.0–15.0)
MCH: 29.2 pg (ref 26.0–34.0)
MCH: 29.3 pg (ref 26.0–34.0)
MCHC: 31.6 g/dL (ref 30.0–36.0)
MCHC: 31.7 g/dL (ref 30.0–36.0)
MCV: 92.6 fL (ref 80.0–100.0)
MCV: 92.4 fL (ref 80.0–100.0)
Platelets: 102 10*3/uL — ABNORMAL LOW (ref 150–400)
Platelets: 119 10*3/uL — ABNORMAL LOW (ref 150–400)
RBC: 3.49 MIL/uL — ABNORMAL LOW (ref 3.87–5.11)
RBC: 3.31 MIL/uL — ABNORMAL LOW (ref 3.87–5.11)
RDW: 15.1 % (ref 11.5–15.5)
RDW: 15.1 % (ref 11.5–15.5)
WBC: 8.6 10*3/uL (ref 4.0–10.5)
WBC: 7.9 10*3/uL (ref 4.0–10.5)
nRBC: 0 % (ref 0.0–0.2)
nRBC: 0 % (ref 0.0–0.2)

## 2019-09-14 LAB — HEMOGLOBIN AND HEMATOCRIT, BLOOD
HCT: 33.7 % — ABNORMAL LOW (ref 36.0–46.0)
Hemoglobin: 10.8 g/dL — ABNORMAL LOW (ref 12.0–15.0)

## 2019-09-14 LAB — HEPARIN LEVEL (UNFRACTIONATED)
Heparin Unfractionated: 0.28 IU/mL — ABNORMAL LOW (ref 0.30–0.70)
Heparin Unfractionated: 0.4 IU/mL (ref 0.30–0.70)
Heparin Unfractionated: 0.36 IU/mL (ref 0.30–0.70)

## 2019-09-14 MED ORDER — SODIUM CHLORIDE 0.9 % IV BOLUS: 1000.0000 mL | Freq: Once | INTRAVENOUS | Status: DC

## 2019-09-14 MED ORDER — ATORVASTATIN CALCIUM 10 MG PO TABS
20.0000 mg | ORAL_TABLET | Freq: Every day | ORAL | Status: DC
  Administered 2019-09-14 – 2019-09-19 (×6): 20 mg via ORAL
  Filled 2019-09-14 (×6): qty 2

## 2019-09-14 MED ORDER — GABAPENTIN 300 MG PO CAPS
300.0000 mg | ORAL_CAPSULE | Freq: Two times a day (BID) | ORAL | Status: DC
  Administered 2019-09-14 – 2019-09-19 (×11): 300 mg via ORAL
  Filled 2019-09-14 (×11): qty 1

## 2019-09-14 MED ORDER — TRANEXAMIC ACID 1000 MG/10ML IV SOLN
500.0000 mg | Freq: Once | INTRAVENOUS | Status: AC
  Administered 2019-09-14: 500 mg via TOPICAL
  Filled 2019-09-14: qty 5

## 2019-09-14 MED ORDER — LACTATED RINGERS IV SOLN
INTRAVENOUS | Status: DC
  Administered 2019-09-17: 100 mL/h via INTRAVENOUS

## 2019-09-14 MED ORDER — FLUOXETINE HCL 20 MG PO CAPS
60.0000 mg | ORAL_CAPSULE | Freq: Every day | ORAL | Status: DC
  Administered 2019-09-14 – 2019-09-19 (×6): 60 mg via ORAL
  Filled 2019-09-14 (×2): qty 6
  Filled 2019-09-14 (×4): qty 3

## 2019-09-14 MED ORDER — ACETAMINOPHEN 325 MG PO TABS
650.0000 mg | ORAL_TABLET | Freq: Four times a day (QID) | ORAL | Status: DC | PRN
  Administered 2019-09-14 – 2019-09-18 (×7): 650 mg via ORAL
  Filled 2019-09-14 (×7): qty 2

## 2019-09-14 MED ORDER — CHLORHEXIDINE GLUCONATE CLOTH 2 % EX PADS
6.0000 | MEDICATED_PAD | Freq: Every day | CUTANEOUS | Status: DC
  Administered 2019-09-14 – 2019-09-17 (×4): 6 via TOPICAL

## 2019-09-14 MED ORDER — "THROMBI-PAD 3""X3"" EX PADS"
1.0000 | MEDICATED_PAD | CUTANEOUS | Status: DC | PRN
  Filled 2019-09-14: qty 1

## 2019-09-14 MED ORDER — PRIMIDONE 250 MG PO TABS
250.0000 mg | ORAL_TABLET | Freq: Two times a day (BID) | ORAL | Status: DC
  Administered 2019-09-14 – 2019-09-19 (×11): 250 mg via ORAL
  Filled 2019-09-14: qty 1
  Filled 2019-09-14: qty 5
  Filled 2019-09-14: qty 1
  Filled 2019-09-14: qty 5
  Filled 2019-09-14 (×2): qty 1
  Filled 2019-09-14: qty 5
  Filled 2019-09-14 (×4): qty 1
  Filled 2019-09-14: qty 5
  Filled 2019-09-14: qty 1

## 2019-09-14 MED ORDER — TRANEXAMIC ACID 1000 MG/10ML IV SOLN
500.0000 mg | Freq: Once | INTRAVENOUS | Status: DC | PRN
  Filled 2019-09-14: qty 5

## 2019-09-14 MED ORDER — TRANEXAMIC ACID FOR EPISTAXIS: 500.0000 mg | Freq: Once | TOPICAL | Status: DC

## 2019-09-14 NOTE — Progress Notes (Deleted)
eLink Physician-Brief Progress Note Patient Name: Julia Gomez DOB: Oct 28, 1950 MRN: [id number]   Date of Service  09/14/2019  HPI/Events of Note  Hypotension - BP = 89/54 with MAP = 65.   eICU Interventions  Plan: 1. Bolus with 0.9 NaCl 1 liter IV over 1 hour now. 2. H/H STAT.     Intervention Category Major Interventions: Hypotension - evaluation and management  Raynald Rouillard Eugene 09/14/2019, 12:40 AM

## 2019-09-14 NOTE — Progress Notes (Signed)
ANTICOAGULATION CONSULT NOTE - Follow Up Consult  Pharmacy Consult for heparin Indication: pulmonary embolus  Allergies  Allergen Reactions  . Codeine Other (See Comments)    numbness    Patient Measurements: Height: 5' 6.5" (168.9 cm) Weight: 99.5 kg (219 lb 5.7 oz) IBW/kg (Calculated) : 60.45 Heparin Dosing Weight: 83.4kg   Vital Signs: Temp: 98.6 F (37 C) (08/26 1507) Temp Source: Oral (08/26 1507) BP: 108/70 (08/26 1700) Pulse Rate: 87 (08/26 1700)  Labs: Recent Labs    09/12/19 1256 09/12/19 1256 09/12/19 1755 09/12/19 2301 09/13/19 0036 09/13/19 0517 09/13/19 0600 09/13/19 1850 09/13/19 1850 09/14/19 0102 09/14/19 0306 09/14/19 1043 09/14/19 1649  HGB 13.3   < >  --  12.1   < > 11.5*   < > 11.7*   < > 10.8* 10.2*  --  9.7*  HCT 43.0   < >  --  38.5   < > 37.1   < > 36.8   < > 33.7* 32.3*  --  30.6*  PLT 209   < >  --  144*   < > 106*   < > 107*  --   --  102*  --  119*  APTT  --   --  27  --   --   --   --   --   --   --   --   --   --   LABPROT  --   --  13.7  --   --   --   --   --   --   --   --   --   --   INR  --   --  1.1  --   --   --   --   --   --   --   --   --   --   HEPARINUNFRC  --   --   --  1.40*   < >  --    < > 0.54   < >  --  0.28* 0.40 0.36  CREATININE 1.34*  --   --   --   --  0.86  --   --   --   --   --   --   --   TROPONINIHS  --   --   --  360*  --   --   --   --   --   --   --   --   --    < > = values in this interval not displayed.    Estimated Creatinine Clearance: 76.3 mL/min (by C-G formula based on SCr of 0.86 mg/dL).   Assessment: 68yofemaleon heparinwith initial dosing for saddle PE, underwent EKOS 8/24.   Heparin level this evening remains therapeutic (HL 0.36, goal of 0.3-0.7). Hgb/Hct slight drop, plts up 119. Still with some bleeding from the femoral line but stable per discussion with RN.    Goal of Therapy:  Heparin level 0.3-0.7 units/ml Monitor platelets by anticoagulation protocol: Yes   Plan:  -  Continue heparin infusion at 1000 units/hr - Will continue to monitor for any signs/symptoms of bleeding and will follow up with heparin level in the a.m.   Thank you for allowing pharmacy to be a part of this patient's care.  Georgina Pillion, PharmD, BCPS Clinical Pharmacist Clinical phone for 09/14/2019: [phone] 09/14/2019 7:11 PM   **Pharmacist phone directory can now be found on [email or web address] ([email or web address]).  Listed under [place].

## 2019-09-14 NOTE — Progress Notes (Signed)
ANTICOAGULATION CONSULT NOTE  Pharmacy Consult for Heparin Indication: saddle pulmonary embolus  Allergies  Allergen Reactions  . Codeine Other (See Comments)    numbness    Patient Measurements: Height: 5' 6.5" (168.9 cm) Weight: 103.3 kg (227 lb 11.8 oz) IBW/kg (Calculated) : 60.45 Heparin Dosing Weight: 83.4kg  Vital Signs: Temp: 98.6 F (37 C) (08/26 0335) Temp Source: Oral (08/26 0335) BP: 111/69 (08/26 0200) Pulse Rate: 95 (08/26 0200)  Labs: Recent Labs    09/12/19 1256 09/12/19 1256 09/12/19 1755 09/12/19 2301 09/13/19 0036 09/13/19 0517 09/13/19 0517 09/13/19 0600 09/13/19 0914 09/13/19 1850 09/13/19 1850 09/14/19 0102 09/14/19 0306  HGB 13.3   < >  --  12.1   < > 11.5*   < >  --   --  11.7*   < > 10.8* 10.2*  HCT 43.0   < >  --  38.5   < > 37.1   < >  --   --  36.8  --  33.7* 32.3*  PLT 209   < >  --  144*   < > 106*  --   --   --  107*  --   --  102*  APTT  --   --  27  --   --   --   --   --   --   --   --   --   --   LABPROT  --   --  13.7  --   --   --   --   --   --   --   --   --   --   INR  --   --  1.1  --   --   --   --   --   --   --   --   --   --   HEPARINUNFRC  --   --   --  1.40*   < >  --   --    < > 1.09* 0.54  --   --  0.28*  CREATININE 1.34*  --   --   --   --  0.86  --   --   --   --   --   --   --   TROPONINIHS  --   --   --  360*  --   --   --   --   --   --   --   --   --    < > = values in this interval not displayed.    Estimated Creatinine Clearance: 77.8 mL/min (by C-G formula based on SCr of 0.86 mg/dL).   Medical History: Past Medical History:  Diagnosis Date  . Allergy   . Asthma   . Depression   . Thyroid disease     Medications:  Scheduled:  . Chlorhexidine Gluconate Cloth  6 each Topical Daily  . docusate sodium  100 mg Oral Daily  . mouth rinse  15 mL Mouth Rinse BID  . polyethylene glycol  17 g Oral Daily  . sodium chloride flush  3 mL Intravenous Q12H  . sodium chloride flush  3 mL Intravenous Q12H  .  Thrombi-Pad  1 each Topical Once   Infusions:  . sodium chloride    . sodium chloride    . sodium chloride Stopped (09/13/19 1007)  . sodium chloride Stopped (09/13/19 1013)  . heparin 1,000 Units/hr (09/14/19 0037)  Assessment: 69yo female on heparin with initial dosing for saddle PE, underwent EKOS 8/24 (+alteplase 12mg  x2). Central line was not placed and pt cannot be stuck for labs given alteplase, so labs are being drawn from PIV. KVO is running in R AC line; heparin is running in R hand line.  One supratherapeutic heparin level was reported at 1.98. However, it is likely that this value was heparin contaminated as it was drawn from the PIV with heparin running. Repeat heparin level remains supratherapeutic at 1.01 drawn from [facility] PIV. Nurse does not report any significant bleeding or problems with the infusion at this time.   PM f/u > discussed with [facility] - patient only had TPA in EKOS, not systemically, and should be okay for phlebotomy sticks since relatively non-invasive.  Heparin level collected by peripheral stick this evening is within goal range at 0.54.  8/26 AM update:  Heparin level just below goal Heparin off for 2 hours around midnight due to femoral oozing which has gotten better>>will not adjust heparin until later today  Goal of Therapy:  Heparin level 0.3-0.7 units/ml Monitor platelets by anticoagulation protocol: Yes   Plan:  Continue IV heparin at 1000 units/hr given off time a few hours ago Re-check heparin level in 6-8 hours Monitor signs/symptoms of bleeding, CBC, and heparin levels daily.  9/26, PharmD, BCPS Clinical Pharmacist Phone: [phone]

## 2019-09-14 NOTE — Progress Notes (Signed)
ANTICOAGULATION CONSULT NOTE - Follow Up Consult  Pharmacy Consult for heparin Indication: pulmonary embolus  Allergies  Allergen Reactions  . Codeine Other (See Comments)    numbness    Patient Measurements: Height: 5' 6.5" (168.9 cm) Weight: 99.5 kg (219 lb 5.7 oz) IBW/kg (Calculated) : 60.45 Heparin Dosing Weight: 83.4kg   Vital Signs: Temp: 99.1 F (37.3 C) (08/26 1116) Temp Source: Oral (08/26 1116) BP: 119/68 (08/26 1200) Pulse Rate: 93 (08/26 1200)  Labs: Recent Labs    09/12/19 1256 09/12/19 1256 09/12/19 1755 09/12/19 2301 09/13/19 0036 09/13/19 0517 09/13/19 0600 09/13/19 1850 09/13/19 1850 09/14/19 0102 09/14/19 0306 09/14/19 1043  HGB 13.3   < >  --  12.1   < > 11.5*   < > 11.7*   < > 10.8* 10.2*  --   HCT 43.0   < >  --  38.5   < > 37.1   < > 36.8  --  33.7* 32.3*  --   PLT 209   < >  --  144*   < > 106*  --  107*  --   --  102*  --   APTT  --   --  27  --   --   --   --   --   --   --   --   --   LABPROT  --   --  13.7  --   --   --   --   --   --   --   --   --   INR  --   --  1.1  --   --   --   --   --   --   --   --   --   HEPARINUNFRC  --   --   --  1.40*   < >  --    < > 0.54  --   --  0.28* 0.40  CREATININE 1.34*  --   --   --   --  0.86  --   --   --   --   --   --   TROPONINIHS  --   --   --  360*  --   --   --   --   --   --   --   --    < > = values in this interval not displayed.    Estimated Creatinine Clearance: 76.3 mL/min (by C-G formula based on SCr of 0.86 mg/dL).   Assessment: 68yofemaleon heparinwith initial dosing for saddle PE, underwent EKOS 8/24 (+alteplase 12mg  x2). Labs were able to be drawn appropriately this morning. Level is therapeutic today at 0.4 with heparin running at 1000 units/hour.   Nurse does not report any significant bleeding or problems with the infusion at this time. There is some minor bleeding from the previous site of the femoral sheath, which was removed yesterday. Topical tranexamic acid was  applied to the site and bleeding has subsided for now. After discussion with primary team, will continue heparin drip with plans to transition to oral anticoagulant therapy in the coming days.    Goal of Therapy:  Heparin level 0.3-0.7 units/ml Monitor platelets by anticoagulation protocol: Yes   Plan:  Continue heparin infusion at 1000 units/hr Continue to monitor femoral sheath site for bleeding, other signs of bleeding, CBC, and heparin levels.  Confirmatory 6hr heparin level at 1800    PGY1 Pharmacy Resident 09/14/2019,12:48 PM

## 2019-09-14 NOTE — Progress Notes (Signed)
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 Echo done at bedside shows right heart strain and blood clots in right femoral vein.  Her systolic blood pressure has been greater than 90 and she was hemodynamically stable. She was required 5L O2 to keep sats above 92 at that time.  Given recent fall where she hit her head, catheter directed thrombolysis seems to be a safer option even though CT head did not show any intracranial hemorrhage. On 8/25 Platelets remain stable at 102. Patient now on RA, continues to have bleeding from R catheter site, Hg 10.2 - Topical tranexamic acid for continued bleeding - Anticoagulate with heparin drip w/ plan to transition to DOAC - Now stable on RA, continue to monitor respiratory status - Continue to monitor vital signs - Will evaluate cause of syncope when patient is stable  AKI, mild Unknown baseline creatinine.  Most recent creatinine at PCP was 1.13 and BUN of 15. Cr on admission of 1.34, down to 0.89 on 8/24.  - Follow intake and output  Hyperlipidemia - Restarted atorvastatin 20 mg QD  Depression - Restarted home fluoxetine 60 mg QD  Prediabetes HgA1c 6.1. Sliding scale if needed. Will monitor glucose  SUMMARY OF TODAY'S PLAN:  - Attempt to stop continued slow bleeding at R catheter site with topical tranexamic acid BRIEF HISTORY:    Julia Gomez is a 69 year old female with past medical history of prediabetes, hyperlipidemia, depression who was admitted for hypoxia and found to have a saddle pulmonary embolism  She will undergo catheter directed thrombolysis and admitted to  the ICU. HISTORY OF PRESENT ILLNESS   Julia Gomez is a 69 year old female with past medical history of prediabetes, hyperlipidemia, depression who was admitted for fatigue and short of breath for the last several days, found to have a saddle pulmonary embolism on CTA chest.  Patient had a recent fall on 09/06/2019 and she saw her PCP.  She states that she she was " blacked out" and fell and hit her head.   Patient is seen at bedside.  She appears comfortable and in no acute respiratory distress.  She denies chest pain, bilateral lower extremity pain, prior history of stroke or internal bleeding.  Patient states that she is not on any blood thinner at home.  She is currently satting well on 5 L nasal cannula.  Blood pressure was 120/80 in the room.  She was tachycardic at 109. SIGNIFICANT PAST MEDICAL HISTORY   Depression Hyperlipidemia Essential Tremor Urinary incontinence SIGNIFICANT EVENTS:  8/24 catheter directed thrombolysis STUDIES:   8/24 CTA chest >> Positive for Acute PE with saddle embolus, a large clot burden, and CT evidence of right heart strain. Small pericardial effusion and pleural effusions. No pulmonary infarct at this time. Heterogeneous enhancement of the spleen, somewhat atypical for the physiologic early splenic enhancement. However, the absence of perisplenic inflammation or fluid argues against acute splenic infarcts.  8/24 head CT >> Left posterior convexity scalp hematoma without underlying skull fracture. Normal for age non contrast CT appearance of the brain.  8/24 cervical CT >> 1. No acute traumatic injury identified in the cervical spine. 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 Mild degenerative spinal stenosis suspected. CULTURES:  COVID negative 8/24 Blood culture 8/24 >>no growth at 24hrs ANTIBIOTICS:  n/a LINES/TUBES:  2 PIVs CONSULTANTS:  IR SUBJECTIVE:  Patient feels her  breathing is greatly improved, now feeling much better on RA. Complains of HA that has been chronic since her fall. Denies any loss of sensation, weakness, or syncope recently.  CONSTITUTIONAL: BP 110/60   Pulse 98   Temp 98.5 F (36.9 C) (Oral)   Resp 16   Ht 5' 6.5" (1.689 m)   Wt 99.5 kg   SpO2 93%   BMI 34.87 kg/m   I/O last 3 completed shifts: In: 1888.7 [P.O.:240; I.V.:1648.7] Out: 2525 [Urine:2525]     PHYSICAL EXAM: General: NAD, ill-appearing Neuro: AxOx3, sensation intact, no focal weakness, Cns II-XII grossly intact HEENT: No scleral icterus, EOM intact Cardiovascular: RRR, no murmurs Lungs: clear breath sounds, no wheezing or crackles Abdomen: soft, non-tender Extremities: No LE edema, some bleeding from her R leg catheter site, improved since removal of catheter; small hematoma adjacent to catheter site Skin: No rash  RESOLVED PROBLEM LIST  n/a Best Practice / Goals of Care / Disposition.   DVT prophylaxis: Heparin drip GI prophylaxis: N/A Glucose control: N/A Mobility: Bedrest Code Status: Full Family Communication: Updated husband at bedside Disposition: ICU  LABS  Glucose Recent Labs  Lab 09/12/19 2207  GLUCAP 97    BMET Recent Labs  Lab 09/12/19 1256 09/13/19 0517  NA 141 138  K 4.4 5.1  CL 108 110  CO2 22 22  BUN 15 13  CREATININE 1.34* 0.86  GLUCOSE 147* 122*    Liver Enzymes No results for input(s): AST, ALT, ALKPHOS, BILITOT, ALBUMIN in the last 168 hours.  Electrolytes Recent Labs  Lab 09/12/19 1256 09/13/19 0517  CALCIUM 8.1* 7.1*  MG  --  2.0  PHOS  --  2.8    CBC Recent Labs  Lab 09/13/19 0517 09/13/19 0517 09/13/19 1850 09/14/19 0102 09/14/19 0306  WBC 9.6  --  9.4  --  8.6  HGB 11.5*   < > 11.7* 10.8* 10.2*  HCT 37.1   < > 36.8 33.7* 32.3*  PLT 106*  --  107*  --  102*   < > = values in this interval not displayed.    ABG No results for input(s): PHART, PCO2ART, PO2ART in the last 168  hours.  Coag's Recent Labs  Lab 09/12/19 1755  APTT 27  INR 1.1    Sepsis Markers Recent Labs  Lab 09/12/19 1256 09/12/19 1400 09/12/19 2301  LATICACIDVEN  --  1.8 1.7  PROCALCITON <0.10  --   --     Cardiac Enzymes No results for input(s): TROPONINI, PROBNP in the last 168 hours.  PAST MEDICAL HISTORY :   She  has a past medical history of Allergy, Asthma, Depression, and Thyroid disease.  PAST SURGICAL HISTORY:  She  has a past surgical history that includes Eye surgery; Breast cyst aspiration (Left); Breast biopsy (Right); IR THROMB F/U EVAL ART/VEN FINAL DAY (MS) (09/13/2019); IR INFUSION THROMBOL ARTERIAL INITIAL (MS) (09/12/2019); IR INFUSION THROMBOL ARTERIAL INITIAL (MS) (09/12/2019); IR Angiogram Selective Each Additional Vessel (09/12/2019); IR Angiogram Selective Each Additional Vessel (09/12/2019); IR Angiogram Pulmonary Bilateral Selective (09/12/2019); and IR US Guide Vasc Access Right (09/12/2019).  Allergies  Allergen Reactions  . Codeine Other (See Comments)    numbness    No current facility-administered medications on  file prior to encounter.   Current Outpatient Medications on File Prior to Encounter  Medication Sig  . atorvastatin (LIPITOR) 20 MG tablet Take 20 mg by mouth at bedtime.  . Biotin 10 MG CAPS Take 1 capsule by mouth daily.  . Calcium Carbonate-Vit D-Min (CALCIUM 1200 PO) Take 1 tablet by mouth daily.  . fish oil-omega-3 fatty acids 1000 MG capsule Take 1 g by mouth daily.   Marland Kitchen FLUoxetine (PROZAC) 20 MG tablet Take 60 mg by mouth in the morning.   . gabapentin (NEURONTIN) 300 MG capsule Take 300 mg by mouth 2 (two) times daily.  . primidone (MYSOLINE) 250 MG tablet Take 250 mg by mouth in the morning and at bedtime.  . trospium (SANCTURA) 20 MG tablet Take 20 mg by mouth 2 (two) times daily.    FAMILY HISTORY:   Her family history includes Aneurysm in her father; Arthritis in her maternal grandmother; COPD in her father; Diverticulitis in her  mother; Heart disease in her maternal grandfather and paternal grandfather; Hyperlipidemia in her mother; Hypothyroidism in her mother.  SOCIAL HISTORY:  She  reports that she has never smoked. She does not have any smokeless tobacco history on file. She reports current alcohol use. She reports that she does not use drugs.  REVIEW OF SYSTEMS:    Per HPI, remainder of ROS negative  Julia Gomez, MS4

## 2019-09-14 NOTE — Progress Notes (Addendum)
Referring Physician(s): Dr. Karle Starch  Supervising Physician: Corrie Mckusick  Patient Status:  [name]  Chief Complaint: Submassive PE; s/p bilateral catheter directed pulmonary arterial thrombolysis by Dr. Pascal Lux 09/12/19  Subjective: Patient sitting up in bed, family member at the bedside. Patient is now on room air, states she is feeling better but she has a bad headache. Right groin site continues to ooze. Her IV heparin was held for a few hours last night and that helped with the oozing. The heparin infusion was resumed and the site is oozing again.   Allergies: Codeine  Medications: Prior to Admission medications   Medication Sig Start Date End Date Taking? Authorizing Provider  atorvastatin (LIPITOR) 20 MG tablet Take 20 mg by mouth at bedtime.   Yes [provider]  Biotin 10 MG CAPS Take 1 capsule by mouth daily.   Yes [provider]  Calcium Carbonate-Vit D-Min (CALCIUM 1200 PO) Take 1 tablet by mouth daily.   Yes [provider]  fish oil-omega-3 fatty acids 1000 MG capsule Take 1 g by mouth daily.    Yes [provider]  FLUoxetine (PROZAC) 20 MG tablet Take 60 mg by mouth in the morning.    Yes [provider]  gabapentin (NEURONTIN) 300 MG capsule Take 300 mg by mouth 2 (two) times daily.   Yes [provider]  primidone (MYSOLINE) 250 MG tablet Take 250 mg by mouth in the morning and at bedtime.   Yes [provider]  trospium (SANCTURA) 20 MG tablet Take 20 mg by mouth 2 (two) times daily.   Yes [provider]     Vital Signs: BP 103/61   Pulse 95   Temp 98.5 F (36.9 C) (Oral)   Resp 18   Ht 5' 6.5" (1.689 m)   Wt 219 lb 5.7 oz (99.5 kg)   SpO2 93%   BMI 34.87 kg/m   Physical Exam  Imaging: CT Head Wo Contrast  Result Date: 09/12/2019 CLINICAL DATA:  69 year old female status post fall, trauma. Dizziness and shortness of breath. EXAM: CT HEAD WITHOUT CONTRAST TECHNIQUE:  Contiguous axial images were obtained from the base of the skull through the vertex without intravenous contrast. COMPARISON:  None. FINDINGS: Brain: Cerebral volume is within normal limits for age. No midline shift, ventriculomegaly, mass effect, evidence of mass lesion, intracranial hemorrhage or evidence of cortically based acute infarction. Gray-white matter differentiation is within normal limits throughout the brain. Vascular: Calcified atherosclerosis at the skull base. No suspicious intracranial vascular hyperdensity. Skull: No fracture identified.  Normal bone mineralization. Sinuses/Orbits: Minimal mucosal thickening and bubbly opacity in the left sphenoid sinus. Other visualized paranasal sinuses and mastoids are well pneumatized. Other: Left posterior convexity mild scalp hematoma (series 4, image 41). Underlying calvarium intact. No other orbit or scalp soft tissue injury identified. IMPRESSION: 1. Left posterior convexity scalp hematoma without underlying skull fracture. 2. Normal for age non contrast CT appearance of the brain. Electronically Signed   By: Genevie Ann M.D.   On: 09/12/2019 16:24   CT Angio Chest PE W/Cm &/Or Wo Cm  Result Date: 09/12/2019 CLINICAL DATA:  69 year old female status post fall, trauma. Dizziness and shortness of breath. Negative for COVID-19 today. EXAM: CT ANGIOGRAPHY CHEST WITH CONTRAST TECHNIQUE: Multidetector CT imaging of the chest was performed using the standard protocol during bolus administration of intravenous contrast. Multiplanar CT image reconstructions and MIPs were obtained to evaluate the vascular anatomy. CONTRAST:  32m OMNIPAQUE IOHEXOL 350 MG/ML  SOLN COMPARISON:  Cervical spine CT today reported separately. FINDINGS: Cardiovascular: Good contrast bolus timing in the pulmonary arterial tree. Positive pulmonary emboli including saddle embolus (series 8, image 67). Thrombus in both main pulmonary arteries, and involving all bilateral lobar branches.  Abnormal RV to LV ratio (series 6, image 185) compatible with right heart strain. There is a small superimposed pericardial effusion. Negative visible aorta aside from mild atherosclerosis. Faint calcified coronary artery atherosclerosis (series 6, image 147). Mediastinum/Nodes: Negative.  No lymphadenopathy. Lungs/Pleura: Trace bilateral layering pleural effusions. Major airways are patent. Mild dependent and perihilar atelectasis. No confluent pulmonary infarct at this time. Upper Abdomen: Negative visible liver, gallbladder, pancreas, adrenal glands, left kidney and bowel. Heterogeneous enhancement of the spleen which can be seen with early contrast timing due to red pulp/white pulp perfusion differences. However, the confluent hypodensity seen on series 5, image 101 is atypical. Still, there is no perisplenic inflammation or fluid evident. Musculoskeletal: No acute or suspicious osseous lesion. Review of the MIP images confirms the above findings. IMPRESSION: 1. Positive for Acute PE with saddle embolus, a large clot burden, and CT evidence of right heart strain. 2. Small pericardial effusion and pleural effusions. No pulmonary infarct at this time. 3. Heterogeneous enhancement of the spleen, somewhat atypical for the physiologic early splenic enhancement. However, the absence of perisplenic inflammation or fluid argues against acute splenic infarcts. Critical Value/emergent results were called by telephone at the time of interpretation on 09/12/2019 at 4:33 pm to [place] who verbally acknowledged these results. Electronically Signed   By: Genevie Ann M.D.   On: 09/12/2019 16:46   CT Cervical Spine Wo Contrast  Result Date: 09/12/2019 CLINICAL DATA:  69 year old female status post fall, trauma. Dizziness and shortness of breath. EXAM: CT CERVICAL SPINE WITHOUT CONTRAST TECHNIQUE: Multidetector CT imaging of the cervical spine was performed without intravenous contrast. Multiplanar CT image reconstructions  were also generated. COMPARISON:  Head CT today reported separately. FINDINGS: Alignment: Straightening of cervical lordosis. Mild degenerative appearing retrolisthesis of C6 on C7. Cervicothoracic junction alignment is within normal limits. Bilateral posterior element alignment is within normal limits. Skull base and vertebrae: Visualized skull base is intact. No atlanto-occipital dissociation. No acute osseous abnormality identified. Soft tissues and spinal canal: No prevertebral fluid or swelling. No visible canal hematoma. Surgical clips about the left thyroid compatible with prior surgery. Otherwise negative noncontrast visible neck soft tissues. Disc levels: Widespread right side cervical facet arthropathy, lower cervical disc and endplate degeneration. 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 Subsequently, mild spinal stenosis is suspected at C4-C5. Upper chest: Visible upper thoracic levels appear intact. Calcified aortic atherosclerosis. Negative visible lung apices. IMPRESSION: 1. No acute traumatic injury identified in the cervical spine. 2. 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 Mild degenerative spinal stenosis suspected. Electronically Signed   By: Genevie Ann M.D.   On: 09/12/2019 16:28   IR Angiogram Pulmonary Bilateral Selective  Result Date: 09/13/2019 INDICATION: Sub massive pulmonary embolism. Patient presents for initiation of bilateral pulmonary arterial catheter directed thrombolysis. EXAM: 1. ULTRASOUND GUIDANCE FOR VENOUS ACCESS X2 2. PULMONARY ARTERIOGRAPHY 3. FLUOROSCOPIC GUIDED PLACEMENT OF BILATERAL PULMONARY ARTERIAL LYTIC INFUSION CATHETERS COMPARISON:  Chest CTA-earlier same day MEDICATIONS: None ANESTHESIA/SEDATION: Moderate (conscious) sedation was employed during this procedure. A total of Versed 1 mg and Fentanyl 100 mcg was administered intravenously.  Moderate Sedation Time: 35 minutes. The patient's level of consciousness and vital  signs were monitored continuously by radiology nursing throughout the procedure under my direct supervision. CONTRAST:  73m OMNIPAQUE IOHEXOL 300 MG/ML  SOLN FLUOROSCOPY TIME:  9 minutes, 12 seconds (96 mGy) COMPLICATIONS: None immediate. TECHNIQUE: Informed written consent was obtained from the patient after a discussion of the risks, benefits and alternatives to treatment. Questions regarding the procedure were encouraged and answered. A timeout was performed prior to the initiation of the procedure. Ultrasound scanning was performed of the right groin and demonstrated wide patency of the right common femoral vein. As such, the right common femoral vein was selected venous access. The right groin was prepped and draped in the usual sterile fashion, and a sterile drape was applied covering the operative field. Maximum barrier sterile technique with sterile gowns and gloves were used for the procedure. A timeout was performed prior to the initiation of the procedure. Local anesthesia was provided with 1% lidocaine. Under direct ultrasound guidance, the right common femoral vein was accessed with a micro puncture kit ultimately allowing placement of a 6 French, 35 cm vascular sheath. Slightly cranial to this initial access, the right common femoral was again accessed with an additional micropuncture kit ultimately allowing placement of an additional 6 French, 35 cm vascular sheath. Ultrasound images were saved for procedural documentation purposes. With the use of a glidewire, a vertebral catheter was advanced into the main pulmonary artery and a limited central pulmonary arteriogram was performed. Pressure measurements were then obtained from the main pulmonary artery. The vertebral catheter was advanced into the distal branch of the right lower lobe pulmonary artery. Limited contrast injection confirmed appropriate positioning. Over  an exchange length roadrunner wire, the vertebral catheter was exchanged for a 90/15 cm multi side-hole infusion catheter. With the use of a glidewire, a vertebral catheter was advanced into a distal branch of the left lower lobe pulmonary artery. Limited contrast injection confirmed appropriate positioning. Over an exchange length roadrunner wire, the pigtail catheter was exchanged for a 90/10 cm multi side-hole EKOS ultrasound assisted infusion catheter. A postprocedural fluoroscopic image was obtained to document final catheter positioning. The external catheter tubing was secured at the right thigh and the lytic therapy was initiated. The patient tolerated the procedure well without immediate postprocedural complication. FINDINGS: Limited central pulmonary arteriogram demonstrates enlargement of the main, right and left pulmonary arteries with near occlusive filling defect seen bilaterally compatible with findings seen on preceding chest CTA. Acquired pressure measurements: Main pulmonary artery-61/32; mean-44 (normal: < 25/10) Following the procedure, both ultrasound assisted infusion catheter tips terminate within the distal aspects of the bilateral lower lobe sub segmental pulmonary arteries. IMPRESSION: 1. Successful fluoroscopic guided initiation of bilateral ultrasound assisted catheter directed pulmonary arterial lysis for sub massive pulmonary embolism and right-sided heart strain. 2. Elevated pressure measurements within the main pulmonary artery compatible with critical pulmonary arterial hypertension. PLAN: - continued monitoring and management per the ICU staff. - morning chest radiograph with bedside pulmonary arterial pressure measurements and catheter removal following 12 hour lytic infusion. Electronically Signed   By: JSandi MariscalM.D.   On: 09/13/2019 08:42   IR Angiogram Selective Each Additional Vessel  Result Date: 09/13/2019 INDICATION: Sub massive pulmonary embolism. Patient presents  for initiation of bilateral pulmonary arterial catheter directed thrombolysis. EXAM: 1. ULTRASOUND GUIDANCE FOR VENOUS ACCESS X2 2. PULMONARY ARTERIOGRAPHY 3. FLUOROSCOPIC GUIDED PLACEMENT OF BILATERAL PULMONARY ARTERIAL LYTIC INFUSION CATHETERS COMPARISON:  Chest CTA-earlier same day MEDICATIONS: None ANESTHESIA/SEDATION: Moderate (conscious) sedation was employed during this procedure. A total of  Versed 1 mg and Fentanyl 100 mcg was administered intravenously. Moderate Sedation Time: 35 minutes. The patient's level of consciousness and vital signs were monitored continuously by radiology nursing throughout the procedure under my direct supervision. CONTRAST:  3m OMNIPAQUE IOHEXOL 300 MG/ML  SOLN FLUOROSCOPY TIME:  9 minutes, 12 seconds (96 mGy) COMPLICATIONS: None immediate. TECHNIQUE: Informed written consent was obtained from the patient after a discussion of the risks, benefits and alternatives to treatment. Questions regarding the procedure were encouraged and answered. A timeout was performed prior to the initiation of the procedure. Ultrasound scanning was performed of the right groin and demonstrated wide patency of the right common femoral vein. As such, the right common femoral vein was selected venous access. The right groin was prepped and draped in the usual sterile fashion, and a sterile drape was applied covering the operative field. Maximum barrier sterile technique with sterile gowns and gloves were used for the procedure. A timeout was performed prior to the initiation of the procedure. Local anesthesia was provided with 1% lidocaine. Under direct ultrasound guidance, the right common femoral vein was accessed with a micro puncture kit ultimately allowing placement of a 6 French, 35 cm vascular sheath. Slightly cranial to this initial access, the right common femoral was again accessed with an additional micropuncture kit ultimately allowing placement of an additional 6 French, 35 cm vascular  sheath. Ultrasound images were saved for procedural documentation purposes. With the use of a glidewire, a vertebral catheter was advanced into the main pulmonary artery and a limited central pulmonary arteriogram was performed. Pressure measurements were then obtained from the main pulmonary artery. The vertebral catheter was advanced into the distal branch of the right lower lobe pulmonary artery. Limited contrast injection confirmed appropriate positioning. Over an exchange length roadrunner wire, the vertebral catheter was exchanged for a 90/15 cm multi side-hole infusion catheter. With the use of a glidewire, a vertebral catheter was advanced into a distal branch of the left lower lobe pulmonary artery. Limited contrast injection confirmed appropriate positioning. Over an exchange length roadrunner wire, the pigtail catheter was exchanged for a 90/10 cm multi side-hole EKOS ultrasound assisted infusion catheter. A postprocedural fluoroscopic image was obtained to document final catheter positioning. The external catheter tubing was secured at the right thigh and the lytic therapy was initiated. The patient tolerated the procedure well without immediate postprocedural complication. FINDINGS: Limited central pulmonary arteriogram demonstrates enlargement of the main, right and left pulmonary arteries with near occlusive filling defect seen bilaterally compatible with findings seen on preceding chest CTA. Acquired pressure measurements: Main pulmonary artery-61/32; mean-44 (normal: < 25/10) Following the procedure, both ultrasound assisted infusion catheter tips terminate within the distal aspects of the bilateral lower lobe sub segmental pulmonary arteries. IMPRESSION: 1. Successful fluoroscopic guided initiation of bilateral ultrasound assisted catheter directed pulmonary arterial lysis for sub massive pulmonary embolism and right-sided heart strain. 2. Elevated pressure measurements within the main pulmonary  artery compatible with critical pulmonary arterial hypertension. PLAN: - continued monitoring and management per the ICU staff. - morning chest radiograph with bedside pulmonary arterial pressure measurements and catheter removal following 12 hour lytic infusion. Electronically Signed   By: JSandi MariscalM.D.   On: 09/13/2019 08:42   IR Angiogram Selective Each Additional Vessel  Result Date: 09/13/2019 INDICATION: Sub massive pulmonary embolism. Patient presents for initiation of bilateral pulmonary arterial catheter directed thrombolysis. EXAM: 1. ULTRASOUND GUIDANCE FOR VENOUS ACCESS X2 2. PULMONARY ARTERIOGRAPHY 3. FLUOROSCOPIC GUIDED PLACEMENT OF BILATERAL PULMONARY ARTERIAL LYTIC  INFUSION CATHETERS COMPARISON:  Chest CTA-earlier same day MEDICATIONS: None ANESTHESIA/SEDATION: Moderate (conscious) sedation was employed during this procedure. A total of Versed 1 mg and Fentanyl 100 mcg was administered intravenously. Moderate Sedation Time: 35 minutes. The patient's level of consciousness and vital signs were monitored continuously by radiology nursing throughout the procedure under my direct supervision. CONTRAST:  6m OMNIPAQUE IOHEXOL 300 MG/ML  SOLN FLUOROSCOPY TIME:  9 minutes, 12 seconds (96 mGy) COMPLICATIONS: None immediate. TECHNIQUE: Informed written consent was obtained from the patient after a discussion of the risks, benefits and alternatives to treatment. Questions regarding the procedure were encouraged and answered. A timeout was performed prior to the initiation of the procedure. Ultrasound scanning was performed of the right groin and demonstrated wide patency of the right common femoral vein. As such, the right common femoral vein was selected venous access. The right groin was prepped and draped in the usual sterile fashion, and a sterile drape was applied covering the operative field. Maximum barrier sterile technique with sterile gowns and gloves were used for the procedure. A timeout  was performed prior to the initiation of the procedure. Local anesthesia was provided with 1% lidocaine. Under direct ultrasound guidance, the right common femoral vein was accessed with a micro puncture kit ultimately allowing placement of a 6 French, 35 cm vascular sheath. Slightly cranial to this initial access, the right common femoral was again accessed with an additional micropuncture kit ultimately allowing placement of an additional 6 French, 35 cm vascular sheath. Ultrasound images were saved for procedural documentation purposes. With the use of a glidewire, a vertebral catheter was advanced into the main pulmonary artery and a limited central pulmonary arteriogram was performed. Pressure measurements were then obtained from the main pulmonary artery. The vertebral catheter was advanced into the distal branch of the right lower lobe pulmonary artery. Limited contrast injection confirmed appropriate positioning. Over an exchange length roadrunner wire, the vertebral catheter was exchanged for a 90/15 cm multi side-hole infusion catheter. With the use of a glidewire, a vertebral catheter was advanced into a distal branch of the left lower lobe pulmonary artery. Limited contrast injection confirmed appropriate positioning. Over an exchange length roadrunner wire, the pigtail catheter was exchanged for a 90/10 cm multi side-hole EKOS ultrasound assisted infusion catheter. A postprocedural fluoroscopic image was obtained to document final catheter positioning. The external catheter tubing was secured at the right thigh and the lytic therapy was initiated. The patient tolerated the procedure well without immediate postprocedural complication. FINDINGS: Limited central pulmonary arteriogram demonstrates enlargement of the main, right and left pulmonary arteries with near occlusive filling defect seen bilaterally compatible with findings seen on preceding chest CTA. Acquired pressure measurements: Main pulmonary  artery-61/32; mean-44 (normal: < 25/10) Following the procedure, both ultrasound assisted infusion catheter tips terminate within the distal aspects of the bilateral lower lobe sub segmental pulmonary arteries. IMPRESSION: 1. Successful fluoroscopic guided initiation of bilateral ultrasound assisted catheter directed pulmonary arterial lysis for sub massive pulmonary embolism and right-sided heart strain. 2. Elevated pressure measurements within the main pulmonary artery compatible with critical pulmonary arterial hypertension. PLAN: - continued monitoring and management per the ICU staff. - morning chest radiograph with bedside pulmonary arterial pressure measurements and catheter removal following 12 hour lytic infusion. Electronically Signed   By: JSandi MariscalM.D.   On: 09/13/2019 08:42   IR [place] Vasc Access Right  Result Date: 09/13/2019 INDICATION: Sub massive pulmonary embolism. Patient presents for initiation of bilateral pulmonary arterial catheter  directed thrombolysis. EXAM: 1. ULTRASOUND GUIDANCE FOR VENOUS ACCESS X2 2. PULMONARY ARTERIOGRAPHY 3. FLUOROSCOPIC GUIDED PLACEMENT OF BILATERAL PULMONARY ARTERIAL LYTIC INFUSION CATHETERS COMPARISON:  Chest CTA-earlier same day MEDICATIONS: None ANESTHESIA/SEDATION: Moderate (conscious) sedation was employed during this procedure. A total of Versed 1 mg and Fentanyl 100 mcg was administered intravenously. Moderate Sedation Time: 35 minutes. The patient's level of consciousness and vital signs were monitored continuously by radiology nursing throughout the procedure under my direct supervision. CONTRAST:  45m OMNIPAQUE IOHEXOL 300 MG/ML  SOLN FLUOROSCOPY TIME:  9 minutes, 12 seconds (96 mGy) COMPLICATIONS: None immediate. TECHNIQUE: Informed written consent was obtained from the patient after a discussion of the risks, benefits and alternatives to treatment. Questions regarding the procedure were encouraged and answered. A timeout was performed prior to  the initiation of the procedure. Ultrasound scanning was performed of the right groin and demonstrated wide patency of the right common femoral vein. As such, the right common femoral vein was selected venous access. The right groin was prepped and draped in the usual sterile fashion, and a sterile drape was applied covering the operative field. Maximum barrier sterile technique with sterile gowns and gloves were used for the procedure. A timeout was performed prior to the initiation of the procedure. Local anesthesia was provided with 1% lidocaine. Under direct ultrasound guidance, the right common femoral vein was accessed with a micro puncture kit ultimately allowing placement of a 6 French, 35 cm vascular sheath. Slightly cranial to this initial access, the right common femoral was again accessed with an additional micropuncture kit ultimately allowing placement of an additional 6 French, 35 cm vascular sheath. Ultrasound images were saved for procedural documentation purposes. With the use of a glidewire, a vertebral catheter was advanced into the main pulmonary artery and a limited central pulmonary arteriogram was performed. Pressure measurements were then obtained from the main pulmonary artery. The vertebral catheter was advanced into the distal branch of the right lower lobe pulmonary artery. Limited contrast injection confirmed appropriate positioning. Over an exchange length roadrunner wire, the vertebral catheter was exchanged for a 90/15 cm multi side-hole infusion catheter. With the use of a glidewire, a vertebral catheter was advanced into a distal branch of the left lower lobe pulmonary artery. Limited contrast injection confirmed appropriate positioning. Over an exchange length roadrunner wire, the pigtail catheter was exchanged for a 90/10 cm multi side-hole EKOS ultrasound assisted infusion catheter. A postprocedural fluoroscopic image was obtained to document final catheter positioning. The  external catheter tubing was secured at the right thigh and the lytic therapy was initiated. The patient tolerated the procedure well without immediate postprocedural complication. FINDINGS: Limited central pulmonary arteriogram demonstrates enlargement of the main, right and left pulmonary arteries with near occlusive filling defect seen bilaterally compatible with findings seen on preceding chest CTA. Acquired pressure measurements: Main pulmonary artery-61/32; mean-44 (normal: < 25/10) Following the procedure, both ultrasound assisted infusion catheter tips terminate within the distal aspects of the bilateral lower lobe sub segmental pulmonary arteries. IMPRESSION: 1. Successful fluoroscopic guided initiation of bilateral ultrasound assisted catheter directed pulmonary arterial lysis for sub massive pulmonary embolism and right-sided heart strain. 2. Elevated pressure measurements within the main pulmonary artery compatible with critical pulmonary arterial hypertension. PLAN: - continued monitoring and management per the ICU staff. - morning chest radiograph with bedside pulmonary arterial pressure measurements and catheter removal following 12 hour lytic infusion. Electronically Signed   By: JSandi MariscalM.D.   On: 09/13/2019 08:42   DG Chest  Port 1 View  Result Date: 09/13/2019 CLINICAL DATA:  Pulmonary artery catheters EXAM: PORTABLE CHEST 1 VIEW COMPARISON:  Yesterday FINDINGS: Bilateral pulmonary artery infusion catheters. The right-sided catheter tip is 3 cm higher than yesterday. The left-sided catheter is unchanged. No visible adjacent pulmonary hemorrhage. Chronic cardiomegaly. Mild streaky opacity asymmetric to the left. Postoperative thoracic inlet. IMPRESSION: Mild shortening of the right-sided pulmonary artery catheter. Electronically Signed   By: Monte Fantasia M.D.   On: 09/13/2019 08:48   DG Chest Port 1 View  Result Date: 09/12/2019 CLINICAL DATA:  Evaluate pulmonary catheter placement  EXAM: PORTABLE CHEST 1 VIEW COMPARISON:  Film from earlier in the same day. FINDINGS: Cardiac shadow is stable. The lungs are clear bilaterally. Infusion catheters are now noted in the pulmonary arteries bilaterally. No sizable effusion or infiltrate is seen. Postsurgical changes in the left neck are noted. IMPRESSION: Pulmonary arterial infusion catheters bilaterally. No acute abnormality noted. Electronically Signed   By: Inez Catalina M.D.   On: 09/12/2019 20:36   DG Chest Port 1 View  Result Date: 09/12/2019 CLINICAL DATA:  Shortness of breath. EXAM: PORTABLE CHEST 1 VIEW COMPARISON:  None. FINDINGS: The heart size and mediastinal contours are within normal limits. Both lungs are clear. No pneumothorax or pleural effusion is noted. The visualized skeletal structures are unremarkable. IMPRESSION: No active disease. Electronically Signed   By: Marijo Conception M.D.   On: 09/12/2019 14:43   ECHOCARDIOGRAM COMPLETE  Result Date: 09/13/2019    ECHOCARDIOGRAM REPORT   Patient Name:   Julia Gomez Date of Exam: 09/13/2019 Medical Rec #:  [id number]     Height:       66.5 in Accession #:    [id number]    Weight:       227.7 lb Date of Birth:  Aug 23, 1950     BSA:          2.125 m Patient Age:    17 years      BP:           149/85 mmHg Patient Gender: F             HR:           102 bpm. Exam Location:  Inpatient Procedure: 2D Echo, Cardiac Doppler and Color Doppler STAT ECHO Indications:    Pulmonary Embolus 415.19 / I26.99  History:        Patient has no prior history of Echocardiogram examinations.                 Risk Factors:Non-Smoker.  Sonographer:    Vickie Epley RDCS Referring Phys: [place]  1. Positive McConnell's sign is present. Acute RV failure consistent with submassive PE is present. Right ventricular systolic function is moderately reduced. The right ventricular size is severely enlarged. There is normal pulmonary artery systolic pressure. The estimated right ventricular  systolic pressure is 81.2 mmHg.  2. Left ventricular ejection fraction, by estimation, is 60 to 65%. The left ventricle has normal function. The left ventricle has no regional wall motion abnormalities. Left ventricular diastolic parameters were normal. There is the interventricular septum is flattened in systole and diastole, consistent with right ventricular pressure and volume overload.  3. Left atrial size was mildly dilated.  4. Moderate pericardial effusion. The pericardial effusion is posterior to the left ventricle. There is no evidence of cardiac tamponade.  5. The mitral valve is grossly normal. Trivial mitral valve regurgitation. No evidence of mitral stenosis.  6. The aortic valve is tricuspid. Aortic valve regurgitation is not visualized. Mild aortic valve sclerosis is present, with no evidence of aortic valve stenosis.  7. The inferior vena cava is normal in size with <50% respiratory variability, suggesting right atrial pressure of 8 mmHg. FINDINGS  Left Ventricle: Left ventricular ejection fraction, by estimation, is 60 to 65%. The left ventricle has normal function. The left ventricle has no regional wall motion abnormalities. The left ventricular internal cavity size was normal in size. There is  no left ventricular hypertrophy. The interventricular septum is flattened in systole and diastole, consistent with right ventricular pressure and volume overload. Left ventricular diastolic parameters were normal. Right Ventricle: Positive McConnell's sign is present. Acute RV failure consistent with submassive PE is present. The right ventricular size is severely enlarged. No increase in right ventricular wall thickness. Right ventricular systolic function is moderately reduced. There is normal pulmonary artery systolic pressure. The tricuspid regurgitant velocity is 2.62 m/s, and with an assumed right atrial pressure of 8 mmHg, the estimated right ventricular systolic pressure is 63.8 mmHg. Left Atrium:  Left atrial size was mildly dilated. Right Atrium: Right atrial size was normal in size. Pericardium: A moderately sized pericardial effusion is present. The pericardial effusion is posterior to the left ventricle. There is no evidence of cardiac tamponade. Presence of pericardial fat pad. Mitral Valve: The mitral valve is grossly normal. Trivial mitral valve regurgitation. No evidence of mitral valve stenosis. Tricuspid Valve: The tricuspid valve is grossly normal. Tricuspid valve regurgitation is mild . No evidence of tricuspid stenosis. Aortic Valve: The aortic valve is tricuspid. . There is mild thickening and mild calcification of the aortic valve. Aortic valve regurgitation is not visualized. Mild aortic valve sclerosis is present, with no evidence of aortic valve stenosis. There is mild thickening of the aortic valve. There is mild calcification of the aortic valve. Pulmonic Valve: The pulmonic valve was grossly normal. Pulmonic valve regurgitation is not visualized. No evidence of pulmonic stenosis. Aorta: The aortic root is normal in size and structure. Venous: The inferior vena cava is normal in size with less than 50% respiratory variability, suggesting right atrial pressure of 8 mmHg. IAS/Shunts: The atrial septum is grossly normal. Additional Comments: A venous catheter is visualized in the inferior vena cava, right atrium and right ventricle.  LEFT VENTRICLE PLAX 2D LVIDd:         4.10 cm     Diastology LVIDs:         2.90 cm     LV e' lateral: 9.46 cm/s LV PW:         0.70 cm     LV e' medial:  9.46 cm/s LV IVS:        0.80 cm LVOT diam:     1.90 cm LV SV:         47 LV SV Index:   22 LVOT Area:     2.84 cm  LV Volumes (MOD) LV vol d, MOD A2C: 54.8 ml LV vol d, MOD A4C: 52.0 ml LV vol s, MOD A2C: 21.9 ml LV vol s, MOD A4C: 30.8 ml LV SV MOD A2C:     32.9 ml LV SV MOD A4C:     52.0 ml LV SV MOD BP:      27.9 ml RIGHT VENTRICLE TAPSE (M-mode): 1.7 cm LEFT ATRIUM             Index      RIGHT ATRIUM  Index LA diam:        3.20 cm 1.51 cm/m RA Area:     17.20 cm LA Vol (A2C):   21.0 ml 9.88 ml/m RA Volume:   48.30 ml  22.73 ml/m LA Vol (A4C):   20.3 ml 9.55 ml/m LA Biplane Vol: 20.8 ml 9.79 ml/m  AORTIC VALVE LVOT Vmax:   96.80 cm/s LVOT Vmean:  67.900 cm/s LVOT VTI:    0.165 m  AORTA Ao Root diam: 2.60 cm TRICUSPID VALVE TR Peak grad:   27.5 mmHg TR Vmax:        262.00 cm/s  SHUNTS Systemic VTI:  0.16 m Systemic Diam: 1.90 cm Eleonore Chiquito MD Electronically signed by Eleonore Chiquito MD Signature Date/Time: 09/13/2019/10:44:57 AM    Final    VAS [place] LOWER EXTREMITY VENOUS (DVT)  Result Date: 09/13/2019  Lower Venous DVTStudy Indications: Pulmonary embolism.  Performing Technologist: Abram Sander RVS  Examination Guidelines: A complete evaluation includes B-mode imaging, spectral Doppler, color Doppler, and power Doppler as needed of all accessible portions of each vessel. Bilateral testing is considered an integral part of a complete examination. Limited examinations for reoccurring indications may be performed as noted. The reflux portion of the exam is performed with the patient in reverse Trendelenburg.  +---------+---------------+---------+-----------+----------+--------------+ RIGHT    CompressibilityPhasicitySpontaneityPropertiesThrombus Aging +---------+---------------+---------+-----------+----------+--------------+ CFV      Full           Yes      Yes                                 +---------+---------------+---------+-----------+----------+--------------+ SFJ      Full                                                        +---------+---------------+---------+-----------+----------+--------------+ FV Prox  Full                                                        +---------+---------------+---------+-----------+----------+--------------+ FV Mid   Full                                                         +---------+---------------+---------+-----------+----------+--------------+ FV DistalFull                                                        +---------+---------------+---------+-----------+----------+--------------+ PFV      Full                                                        +---------+---------------+---------+-----------+----------+--------------+ POP      Full  Yes      Yes                                 +---------+---------------+---------+-----------+----------+--------------+ PTV      Full                                                        +---------+---------------+---------+-----------+----------+--------------+ PERO     Full                                                        +---------+---------------+---------+-----------+----------+--------------+   +---------+---------------+---------+-----------+----------+--------------+ LEFT     CompressibilityPhasicitySpontaneityPropertiesThrombus Aging +---------+---------------+---------+-----------+----------+--------------+ CFV      Full           Yes      Yes                                 +---------+---------------+---------+-----------+----------+--------------+ SFJ      Full                                                        +---------+---------------+---------+-----------+----------+--------------+ FV Prox  Full                                                        +---------+---------------+---------+-----------+----------+--------------+ FV Mid   Full                                                        +---------+---------------+---------+-----------+----------+--------------+ FV DistalFull                                                        +---------+---------------+---------+-----------+----------+--------------+ PFV      Full                                                         +---------+---------------+---------+-----------+----------+--------------+ POP      None           No       No                   Acute          +---------+---------------+---------+-----------+----------+--------------+ PTV      None  Acute          +---------+---------------+---------+-----------+----------+--------------+ PERO     None                                         Acute          +---------+---------------+---------+-----------+----------+--------------+     Summary: RIGHT: - No evidence of deep vein thrombosis in the lower extremity. No indirect evidence of obstruction proximal to the inguinal ligament.  LEFT: - Findings consistent with acute deep vein thrombosis involving the left popliteal vein, left posterior tibial veins, and left peroneal veins. - No cystic structure found in the popliteal fossa.  *See table(s) above for measurements and observations.    Preliminary    IR INFUSION THROMBOL ARTERIAL INITIAL (MS)  Result Date: 09/13/2019 INDICATION: Sub massive pulmonary embolism. Patient presents for initiation of bilateral pulmonary arterial catheter directed thrombolysis. EXAM: 1. ULTRASOUND GUIDANCE FOR VENOUS ACCESS X2 2. PULMONARY ARTERIOGRAPHY 3. FLUOROSCOPIC GUIDED PLACEMENT OF BILATERAL PULMONARY ARTERIAL LYTIC INFUSION CATHETERS COMPARISON:  Chest CTA-earlier same day MEDICATIONS: None ANESTHESIA/SEDATION: Moderate (conscious) sedation was employed during this procedure. A total of Versed 1 mg and Fentanyl 100 mcg was administered intravenously. Moderate Sedation Time: 35 minutes. The patient's level of consciousness and vital signs were monitored continuously by radiology nursing throughout the procedure under my direct supervision. CONTRAST:  7m OMNIPAQUE IOHEXOL 300 MG/ML  SOLN FLUOROSCOPY TIME:  9 minutes, 12 seconds (96 mGy) COMPLICATIONS: None immediate. TECHNIQUE: Informed written consent was obtained from the patient  after a discussion of the risks, benefits and alternatives to treatment. Questions regarding the procedure were encouraged and answered. A timeout was performed prior to the initiation of the procedure. Ultrasound scanning was performed of the right groin and demonstrated wide patency of the right common femoral vein. As such, the right common femoral vein was selected venous access. The right groin was prepped and draped in the usual sterile fashion, and a sterile drape was applied covering the operative field. Maximum barrier sterile technique with sterile gowns and gloves were used for the procedure. A timeout was performed prior to the initiation of the procedure. Local anesthesia was provided with 1% lidocaine. Under direct ultrasound guidance, the right common femoral vein was accessed with a micro puncture kit ultimately allowing placement of a 6 French, 35 cm vascular sheath. Slightly cranial to this initial access, the right common femoral was again accessed with an additional micropuncture kit ultimately allowing placement of an additional 6 French, 35 cm vascular sheath. Ultrasound images were saved for procedural documentation purposes. With the use of a glidewire, a vertebral catheter was advanced into the main pulmonary artery and a limited central pulmonary arteriogram was performed. Pressure measurements were then obtained from the main pulmonary artery. The vertebral catheter was advanced into the distal branch of the right lower lobe pulmonary artery. Limited contrast injection confirmed appropriate positioning. Over an exchange length roadrunner wire, the vertebral catheter was exchanged for a 90/15 cm multi side-hole infusion catheter. With the use of a glidewire, a vertebral catheter was advanced into a distal branch of the left lower lobe pulmonary artery. Limited contrast injection confirmed appropriate positioning. Over an exchange length roadrunner wire, the pigtail catheter was exchanged  for a 90/10 cm multi side-hole EKOS ultrasound assisted infusion catheter. A postprocedural fluoroscopic image was obtained to document final catheter positioning. The external catheter tubing was secured at the  right thigh and the lytic therapy was initiated. The patient tolerated the procedure well without immediate postprocedural complication. FINDINGS: Limited central pulmonary arteriogram demonstrates enlargement of the main, right and left pulmonary arteries with near occlusive filling defect seen bilaterally compatible with findings seen on preceding chest CTA. Acquired pressure measurements: Main pulmonary artery-61/32; mean-44 (normal: < 25/10) Following the procedure, both ultrasound assisted infusion catheter tips terminate within the distal aspects of the bilateral lower lobe sub segmental pulmonary arteries. IMPRESSION: 1. Successful fluoroscopic guided initiation of bilateral ultrasound assisted catheter directed pulmonary arterial lysis for sub massive pulmonary embolism and right-sided heart strain. 2. Elevated pressure measurements within the main pulmonary artery compatible with critical pulmonary arterial hypertension. PLAN: - continued monitoring and management per the ICU staff. - morning chest radiograph with bedside pulmonary arterial pressure measurements and catheter removal following 12 hour lytic infusion. Electronically Signed   By: Sandi Mariscal M.D.   On: 09/13/2019 08:42   IR THROMB F/U EVAL ART/VEN FINAL DAY (MS)  Result Date: 09/13/2019 INDICATION: 69 year old female with a history of submassive pulmonary embolism, status post catheter directed lysis EXAM: FOLLOW-UP PULMONARY ARTERY EMBOLISM LYSIS COMPARISON:  CT 09/12/2019, initial angiogram 09/12/2019 MEDICATIONS: None. ANESTHESIA/SEDATION: None FLUOROSCOPY TIME:  None COMPLICATIONS: None TECHNIQUE: Informed written consent was obtained from the patient after a thorough discussion of the procedural risks, benefits and  alternatives. All questions were addressed. Maximal Sterile Barrier Technique was utilized including caps, mask, sterile gowns, sterile gloves, sterile drape, hand hygiene and skin antiseptic. A timeout was performed prior to the initiation of the procedure. Clean procedure was used to do bedside trans duction of the right pulmonary artery lytic catheter. Once the pressure measurement was recorded, all catheters and sheaths were removed. Manual pressure was used for hemostasis and clean bandages were placed. Patient tolerated the procedure well and remained hemodynamically stable throughout. No subsequent blood loss. FINDINGS: Initial pressure measurement on yesterday's study main pulmonary artery: 61/32 (44) Follow-up pressure on today's study main pulmonary artery: 30/12 (19) IMPRESSION: Follow-up pulmonary artery lysis demonstrates reduction in the main pulmonary artery measured pressures, status post bilateral pulmonary artery catheter directed lysis. Signed, Dulcy Fanny. Dellia Nims, RPVI [facility] Electronically Signed   By: Corrie Mckusick D.O.   On: 09/13/2019 11:59    Labs:  CBC: Recent Labs    09/13/19 0036 09/13/19 0036 09/13/19 0517 09/13/19 1850 09/14/19 0102 09/14/19 0306  WBC 10.1  --  9.6 9.4  --  8.6  HGB 12.5   < > 11.5* 11.7* 10.8* 10.2*  HCT 38.6   < > 37.1 36.8 33.7* 32.3*  PLT 156  --  106* 107*  --  102*   < > = values in this interval not displayed.    COAGS: Recent Labs    09/12/19 1755  INR 1.1  APTT 27    BMP: Recent Labs    09/12/19 1256 09/13/19 0517  NA 141 138  K 4.4 5.1  CL 108 110  CO2 22 22  GLUCOSE 147* 122*  BUN 15 13  CALCIUM 8.1* 7.1*  CREATININE 1.34* 0.86  GFRNONAA 41* >60  GFRAA 47* >60    LIVER FUNCTION TESTS: No results for input(s): BILITOT, AST, ALT, ALKPHOS, PROT, ALBUMIN in the last 8760 hours.  Assessment and Plan:  Submassive PE, s/p bilateral catheter directed  pulmonary arterial thrombolysis by Dr. Pascal Lux 09/12/19. Hemoglobin has dropped slightly. Sheaths were pulled yesterday and the groin site continues to ooze. Advised  patient and bedside RN not to raise [facility] greater than 30 degrees. Pressure held. Gauze soaked in topical tranexamic acid was applied. A rolled wash cloth was placed over the site and taped down. A 5lb sand bag was placed over this.   IR will continue to follow. Please call IR with any questions.   Electronically Signed: Soyla Dryer, AGACNP-BC [phone] 09/14/2019, 11:11 AM   I spent a total of 25 Minutes at the the patient's bedside AND on the patient's hospital floor or unit, greater than 50% of which was counseling/coordinating care for catheter directed pulmonary arterial thrombolysis.

## 2019-09-14 NOTE — Progress Notes (Signed)
Patient's right femoral site, post-sheath removal, has been oozing blood. MD's Dr. Merrily Pew and NP Alwyn Ren aware and they personally examined the femoral site this morning. Dr. Merrily Pew spoke with pharmacy and topical Tranexamic Acid was ordered in attempt to help stop the bleeding as thrombi-pads did not seem to work that well.   Transexamic Acid was squirted onto gauze and enforced with tape. A 5 LB weight bag was placed on top. Patient HOB placed at 15-20 degree angle.  With afternoon assessment, the bleeding appears to have stopped/slowed due to little to no blood soiling on dressing but restarted when changing dressing.   Will continue to monitor.  Darrold Span, RN

## 2019-09-14 NOTE — Progress Notes (Signed)
eLink Physician-Brief Progress Note Patient Name: Julia Gomez DOB: 1950/02/22 MRN: [id number]   Date of Service  09/14/2019  HPI/Events of Note  Nursing needs to draw heparin level. Patient now less than 24 hours out from EKOS.  eICU Interventions  May stick for heparin level.      Intervention Category Major Interventions: Other:  Yomaris Palecek Dennard Nip 09/14/2019, 2:19 AM

## 2019-09-15 LAB — BASIC METABOLIC PANEL
Anion gap: 10 (ref 5–15)
BUN: 12 mg/dL (ref 8–23)
CO2: 23 mmol/L (ref 22–32)
Calcium: 7.5 mg/dL — ABNORMAL LOW (ref 8.9–10.3)
Chloride: 107 mmol/L (ref 98–111)
Creatinine, Ser: 0.93 mg/dL (ref 0.44–1.00)
GFR calc Af Amer: >60 mL/min (ref >60)
GFR calc non Af Amer: >60 mL/min (ref >60)
Glucose, Bld: 117 mg/dL — ABNORMAL HIGH (ref 70–99)
Potassium: 3.9 mmol/L (ref 3.5–5.1)
Sodium: 140 mmol/L (ref 135–145)

## 2019-09-15 LAB — CBC
HCT: 28.5 % — ABNORMAL LOW (ref 36.0–46.0)
Hemoglobin: 8.9 g/dL — ABNORMAL LOW (ref 12.0–15.0)
MCH: 29 pg (ref 26.0–34.0)
MCHC: 31.2 g/dL (ref 30.0–36.0)
MCV: 92.8 fL (ref 80.0–100.0)
Platelets: 131 10*3/uL — ABNORMAL LOW (ref 150–400)
RBC: 3.07 MIL/uL — ABNORMAL LOW (ref 3.87–5.11)
RDW: 15.3 % (ref 11.5–15.5)
WBC: 8 10*3/uL (ref 4.0–10.5)
nRBC: 0 % (ref 0.0–0.2)

## 2019-09-15 LAB — HEPARIN LEVEL (UNFRACTIONATED)
Heparin Unfractionated: 0.29 IU/mL — ABNORMAL LOW (ref 0.30–0.70)
Heparin Unfractionated: 0.52 IU/mL (ref 0.30–0.70)

## 2019-09-15 MED ORDER — SODIUM CHLORIDE 0.9 % IV BOLUS
1000.0000 mL | Freq: Once | INTRAVENOUS | Status: AC
  Administered 2019-09-15: 1000 mL via INTRAVENOUS

## 2019-09-15 NOTE — Progress Notes (Addendum)
ANTICOAGULATION CONSULT NOTE - Follow Up Consult  Pharmacy Consult for heparin  Indication: saddle pulmonary embolus  Allergies  Allergen Reactions  . Codeine Other (See Comments)    numbness    Patient Measurements: Height: 5' 6.5" (168.9 cm) Weight: 99.2 kg (218 lb 11.1 oz) IBW/kg (Calculated) : 60.45 Heparin Dosing Weight: 83.4kg  Vital Signs: Temp: 98.9 F (37.2 C) (08/27 0700) Temp Source: Oral (08/27 0700) BP: 98/85 (08/27 0900) Pulse Rate: 94 (08/27 0900)  Labs: Recent Labs    09/12/19 1256 09/12/19 1256 09/12/19 1755 09/12/19 2301 09/13/19 0036 09/13/19 0517 09/13/19 0600 09/14/19 0306 09/14/19 0306 09/14/19 1043 09/14/19 1649 09/15/19 0147 09/15/19 0752  HGB 13.3   < >  --  12.1   < > 11.5*   < > 10.2*   < >  --  9.7* 8.9*  --   HCT 43.0   < >  --  38.5   < > 37.1   < > 32.3*  --   --  30.6* 28.5*  --   PLT 209   < >  --  144*   < > 106*   < > 102*  --   --  119* 131*  --   APTT  --   --  27  --   --   --   --   --   --   --   --   --   --   LABPROT  --   --  13.7  --   --   --   --   --   --   --   --   --   --   INR  --   --  1.1  --   --   --   --   --   --   --   --   --   --   HEPARINUNFRC  --   --   --  1.40*   < >  --    < > 0.28*   < > 0.40 0.36  --  0.29*  CREATININE 1.34*  --   --   --   --  0.86  --   --   --   --   --   --   --   TROPONINIHS  --   --   --  360*  --   --   --   --   --   --   --   --   --    < > = values in this interval not displayed.    Estimated Creatinine Clearance: 76.2 mL/min (by C-G formula based on SCr of 0.86 mg/dL).   Assessment: 68yofemaleon heparinwith initial dosing for saddle PE, underwent EKOS 8/24 (+alteplase 12mg  x2). Level is subtherapeutic today at 0.29 with heparin running at 1000 units/hour. Per nursing, heparin was not held overnight.   Nursing reports moderate bleeding after removal of an IV line last night (8/26). Bleeding from the femoral sheath site has been controlled and resolved for now. I  spoke with the patient, nurse, and NP from IR. Among them, they reported the IV site and the femoral site both feel and look okay now and the bleeding seems to be controlled. Given this patient's original significant clot burden, will continue to pursue therapeutic levels.   Pt h/h is downtrended to Hgb 8.9, HCT 28.5 with PLTC of 131.   Goal of Therapy:  Heparin level  0.3-0.7 units/ml Monitor platelets by anticoagulation protocol: Yes   Plan:  Increase the heparin infusion at 1150 units/hr  Monitor for signs/symptoms/amount of bleeding, CBC, and heparin levels 6 hour heparin level 1630   De Burrs  PGY1 Pharmacy Resident 09/15/2019,9:54 AM

## 2019-09-15 NOTE — Progress Notes (Signed)
eLink Physician-Brief Progress Note Patient Name: Julia Gomez DOB: 11-17-1950 MRN: [id number]   Date of Service  09/15/2019  HPI/Events of Note  Oliguria - bladder scan with 174 mL. LVEF = 60-65%. No CVL or CVP.  eICU Interventions  Plan: 1. Bolus with 0.9 NaCl 1 liter IV over 1 hour now.      Intervention Category Major Interventions: Other:  [name] 09/15/2019, 6:53 AM

## 2019-09-15 NOTE — Progress Notes (Signed)
Referring Physician(s): Dr. Karle Starch  Supervising Physician: Daryll Brod  Patient Status:  [facility] - In-pt  Chief Complaint: Submassive PE; s/p bilateral catheter directed pulmonary arterial thrombolysis by Dr. Pascal Lux 09/12/19  Subjective: Patient sitting up in bed, finishing up her breakfast. She reports a better night, she is feeling better, and the bleeding from her groin has slowed down. Bedside RN states the dressing was last changed around 4 am.   Allergies: Codeine  Medications: Prior to Admission medications   Medication Sig Start Date End Date Taking? Authorizing Provider  atorvastatin (LIPITOR) 20 MG tablet Take 20 mg by mouth at bedtime.   Yes [provider]  Biotin 10 MG CAPS Take 1 capsule by mouth daily.   Yes [provider]  Calcium Carbonate-Vit D-Min (CALCIUM 1200 PO) Take 1 tablet by mouth daily.   Yes [provider]  fish oil-omega-3 fatty acids 1000 MG capsule Take 1 g by mouth daily.    Yes [provider]  FLUoxetine (PROZAC) 20 MG tablet Take 60 mg by mouth in the morning.    Yes [provider]  gabapentin (NEURONTIN) 300 MG capsule Take 300 mg by mouth 2 (two) times daily.   Yes [provider]  primidone (MYSOLINE) 250 MG tablet Take 250 mg by mouth in the morning and at bedtime.   Yes [provider]  trospium (SANCTURA) 20 MG tablet Take 20 mg by mouth 2 (two) times daily.   Yes [provider]     Vital Signs: BP 98/85   Pulse 94   Temp 98.9 F (37.2 C) (Oral)   Resp 15   Ht 5' 6.5" (1.689 m)   Wt 218 lb 11.1 oz (99.2 kg)   SpO2 93%   BMI 34.77 kg/m   Physical Exam Constitutional:      General: She is not in acute distress. Cardiovascular:     Rate and Rhythm: Normal rate and regular rhythm.     Comments: Right groin site is clean and dry. Surrounding site is soft. The site is covered in gauze, a rolled wash cloth, tape and a 5 lb sand bag.  Pulmonary:     Effort:  Pulmonary effort is normal.     Comments: Room air Skin:    General: Skin is warm and dry.  Neurological:     Mental Status: She is alert and oriented to person, place, and time.     Imaging: CT Head Wo Contrast  Result Date: 09/12/2019 CLINICAL DATA:  69 year old female status post fall, trauma. Dizziness and shortness of breath. EXAM: CT HEAD WITHOUT CONTRAST TECHNIQUE: Contiguous axial images were obtained from the base of the skull through the vertex without intravenous contrast. COMPARISON:  None. FINDINGS: Brain: Cerebral volume is within normal limits for age. No midline shift, ventriculomegaly, mass effect, evidence of mass lesion, intracranial hemorrhage or evidence of cortically based acute infarction. Gray-white matter differentiation is within normal limits throughout the brain. Vascular: Calcified atherosclerosis at the skull base. No suspicious intracranial vascular hyperdensity. Skull: No fracture identified.  Normal bone mineralization. Sinuses/Orbits: Minimal mucosal thickening and bubbly opacity in the left sphenoid sinus. Other visualized paranasal sinuses and mastoids are well pneumatized. Other: Left posterior convexity mild scalp hematoma (series 4, image 41). Underlying calvarium intact. No other orbit or scalp soft tissue injury identified. IMPRESSION: 1. Left posterior convexity scalp hematoma without underlying skull fracture. 2. Normal for age non contrast CT appearance of the brain. Electronically Signed   By: Lemmie Evens  [place] Crane M.D.   On: 09/12/2019 16:24   CT Angio Chest PE W/Cm &/Or Wo Cm  Result Date: 09/12/2019 CLINICAL DATA:  69 year old female status post fall, trauma. Dizziness and shortness of breath. Negative for COVID-19 today. EXAM: CT ANGIOGRAPHY CHEST WITH CONTRAST TECHNIQUE: Multidetector CT imaging of the chest was performed using the standard protocol during bolus administration of intravenous contrast. Multiplanar CT image reconstructions and MIPs were obtained to  evaluate the vascular anatomy. CONTRAST:  64m OMNIPAQUE IOHEXOL 350 MG/ML SOLN COMPARISON:  Cervical spine CT today reported separately. FINDINGS: Cardiovascular: Good contrast bolus timing in the pulmonary arterial tree. Positive pulmonary emboli including saddle embolus (series 8, image 67). Thrombus in both main pulmonary arteries, and involving all bilateral lobar branches. Abnormal RV to LV ratio (series 6, image 185) compatible with right heart strain. There is a small superimposed pericardial effusion. Negative visible aorta aside from mild atherosclerosis. Faint calcified coronary artery atherosclerosis (series 6, image 147). Mediastinum/Nodes: Negative.  No lymphadenopathy. Lungs/Pleura: Trace bilateral layering pleural effusions. Major airways are patent. Mild dependent and perihilar atelectasis. No confluent pulmonary infarct at this time. Upper Abdomen: Negative visible liver, gallbladder, pancreas, adrenal glands, left kidney and bowel. Heterogeneous enhancement of the spleen which can be seen with early contrast timing due to red pulp/white pulp perfusion differences. However, the confluent hypodensity seen on series 5, image 101 is atypical. Still, there is no perisplenic inflammation or fluid evident. Musculoskeletal: No acute or suspicious osseous lesion. Review of the MIP images confirms the above findings. IMPRESSION: 1. Positive for Acute PE with saddle embolus, a large clot burden, and CT evidence of right heart strain. 2. Small pericardial effusion and pleural effusions. No pulmonary infarct at this time. 3. Heterogeneous enhancement of the spleen, somewhat atypical for the physiologic early splenic enhancement. However, the absence of perisplenic inflammation or fluid argues against acute splenic infarcts. Critical Value/emergent results were called by telephone at the time of interpretation on 09/12/2019 at 4:33 pm to PPamplicowho verbally acknowledged these results. Electronically  Signed   By: HGenevie AnnM.D.   On: 09/12/2019 16:46   CT Cervical Spine Wo Contrast  Result Date: 09/12/2019 CLINICAL DATA:  69year old female status post fall, trauma. Dizziness and shortness of breath. EXAM: CT CERVICAL SPINE WITHOUT CONTRAST TECHNIQUE: Multidetector CT imaging of the cervical spine was performed without intravenous contrast. Multiplanar CT image reconstructions were also generated. COMPARISON:  Head CT today reported separately. FINDINGS: Alignment: Straightening of cervical lordosis. Mild degenerative appearing retrolisthesis of C6 on C7. Cervicothoracic junction alignment is within normal limits. Bilateral posterior element alignment is within normal limits. Skull base and vertebrae: Visualized skull base is intact. No atlanto-occipital dissociation. No acute osseous abnormality identified. Soft tissues and spinal canal: No prevertebral fluid or swelling. No visible canal hematoma. Surgical clips about the left thyroid compatible with prior surgery. Otherwise negative noncontrast visible neck soft tissues. Disc levels: Widespread right side cervical facet arthropathy, lower cervical disc and endplate degeneration. 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 Subsequently, mild spinal stenosis is suspected at C4-C5. Upper chest: Visible upper thoracic levels appear intact. Calcified aortic atherosclerosis. Negative visible lung apices. IMPRESSION: 1. No acute traumatic injury identified in the cervical spine. 2. 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 Mild degenerative spinal stenosis suspected. Electronically Signed   By: HGenevie AnnM.D.   On: 09/12/2019 16:28   IR Angiogram Pulmonary Bilateral Selective  Result Date: 09/13/2019 INDICATION: Sub massive pulmonary embolism. Patient presents for initiation of bilateral pulmonary arterial catheter directed thrombolysis. EXAM:  1. ULTRASOUND GUIDANCE FOR VENOUS ACCESS X2 2. PULMONARY ARTERIOGRAPHY 3. FLUOROSCOPIC GUIDED PLACEMENT OF BILATERAL PULMONARY ARTERIAL LYTIC INFUSION CATHETERS COMPARISON:  Chest CTA-earlier same day MEDICATIONS: None ANESTHESIA/SEDATION: Moderate (conscious) sedation was employed during this procedure. A total of Versed 1 mg and Fentanyl 100 mcg was administered intravenously. Moderate Sedation Time: 35 minutes. The patient's level of consciousness and vital signs were monitored continuously by radiology nursing throughout the procedure under my direct supervision. CONTRAST:  46m OMNIPAQUE IOHEXOL 300 MG/ML  SOLN FLUOROSCOPY TIME:  9 minutes, 12 seconds (96 mGy) COMPLICATIONS: None immediate. TECHNIQUE: Informed written consent was obtained from the patient after a discussion of the risks, benefits and alternatives to treatment. Questions regarding the procedure were encouraged and answered. A timeout was performed prior to the initiation of the procedure. Ultrasound scanning was performed of the right groin and demonstrated wide patency of the right common femoral vein. As such, the right common femoral vein was selected venous access. The right groin was prepped and draped in the usual sterile fashion, and a sterile drape was applied covering the operative field. Maximum barrier sterile technique with sterile gowns and gloves were used for the procedure. A timeout was performed prior to the initiation of the procedure. Local anesthesia was provided with 1% lidocaine. Under direct ultrasound guidance, the right common femoral vein was accessed with a micro puncture kit ultimately allowing placement of a 6 French, 35 cm vascular sheath. Slightly cranial to this initial access, the right common femoral was again accessed with an additional micropuncture kit ultimately allowing placement of an additional 6 French, 35 cm vascular sheath. Ultrasound images were saved for procedural documentation purposes. With the  use of a glidewire, a vertebral catheter was advanced into the main pulmonary artery and a limited central pulmonary arteriogram was performed. Pressure measurements were then obtained from the main pulmonary artery. The vertebral catheter was advanced into the distal branch of the right lower lobe pulmonary artery. Limited contrast injection confirmed appropriate positioning. Over an exchange length roadrunner wire, the vertebral catheter was exchanged for a 90/15 cm multi side-hole infusion catheter. With the use of a glidewire, a vertebral catheter was advanced into a distal branch of the left lower lobe pulmonary artery. Limited contrast injection confirmed appropriate positioning. Over an exchange length roadrunner wire, the pigtail catheter was exchanged for a 90/10 cm multi side-hole EKOS ultrasound assisted infusion catheter. A postprocedural fluoroscopic image was obtained to document final catheter positioning. The external catheter tubing was secured at the right thigh and the lytic therapy was initiated. The patient tolerated the procedure well without immediate postprocedural complication. FINDINGS: Limited central pulmonary arteriogram demonstrates enlargement of the main, right and left pulmonary arteries with near occlusive filling defect seen bilaterally compatible with findings seen on preceding chest CTA. Acquired pressure measurements: Main pulmonary artery-61/32; mean-44 (normal: < 25/10) Following the procedure, both ultrasound assisted infusion catheter tips terminate within the distal aspects of the bilateral lower lobe sub segmental pulmonary arteries. IMPRESSION: 1. Successful fluoroscopic guided initiation of bilateral ultrasound assisted catheter directed pulmonary arterial lysis for sub massive pulmonary embolism and right-sided heart strain. 2. Elevated pressure measurements within the main pulmonary artery compatible with critical pulmonary arterial hypertension. PLAN: - continued  monitoring and management per the ICU staff. - morning chest radiograph with bedside pulmonary arterial pressure measurements and catheter removal following 12 hour lytic infusion.  Electronically Signed   By: Sandi Mariscal M.D.   On: 09/13/2019 08:42   IR Angiogram Selective Each Additional Vessel  Result Date: 09/13/2019 INDICATION: Sub massive pulmonary embolism. Patient presents for initiation of bilateral pulmonary arterial catheter directed thrombolysis. EXAM: 1. ULTRASOUND GUIDANCE FOR VENOUS ACCESS X2 2. PULMONARY ARTERIOGRAPHY 3. FLUOROSCOPIC GUIDED PLACEMENT OF BILATERAL PULMONARY ARTERIAL LYTIC INFUSION CATHETERS COMPARISON:  Chest CTA-earlier same day MEDICATIONS: None ANESTHESIA/SEDATION: Moderate (conscious) sedation was employed during this procedure. A total of Versed 1 mg and Fentanyl 100 mcg was administered intravenously. Moderate Sedation Time: 35 minutes. The patient's level of consciousness and vital signs were monitored continuously by radiology nursing throughout the procedure under my direct supervision. CONTRAST:  55m OMNIPAQUE IOHEXOL 300 MG/ML  SOLN FLUOROSCOPY TIME:  9 minutes, 12 seconds (96 mGy) COMPLICATIONS: None immediate. TECHNIQUE: Informed written consent was obtained from the patient after a discussion of the risks, benefits and alternatives to treatment. Questions regarding the procedure were encouraged and answered. A timeout was performed prior to the initiation of the procedure. Ultrasound scanning was performed of the right groin and demonstrated wide patency of the right common femoral vein. As such, the right common femoral vein was selected venous access. The right groin was prepped and draped in the usual sterile fashion, and a sterile drape was applied covering the operative field. Maximum barrier sterile technique with sterile gowns and gloves were used for the procedure. A timeout was performed prior to the initiation of the procedure. Local anesthesia was provided  with 1% lidocaine. Under direct ultrasound guidance, the right common femoral vein was accessed with a micro puncture kit ultimately allowing placement of a 6 French, 35 cm vascular sheath. Slightly cranial to this initial access, the right common femoral was again accessed with an additional micropuncture kit ultimately allowing placement of an additional 6 French, 35 cm vascular sheath. Ultrasound images were saved for procedural documentation purposes. With the use of a glidewire, a vertebral catheter was advanced into the main pulmonary artery and a limited central pulmonary arteriogram was performed. Pressure measurements were then obtained from the main pulmonary artery. The vertebral catheter was advanced into the distal branch of the right lower lobe pulmonary artery. Limited contrast injection confirmed appropriate positioning. Over an exchange length roadrunner wire, the vertebral catheter was exchanged for a 90/15 cm multi side-hole infusion catheter. With the use of a glidewire, a vertebral catheter was advanced into a distal branch of the left lower lobe pulmonary artery. Limited contrast injection confirmed appropriate positioning. Over an exchange length roadrunner wire, the pigtail catheter was exchanged for a 90/10 cm multi side-hole EKOS ultrasound assisted infusion catheter. A postprocedural fluoroscopic image was obtained to document final catheter positioning. The external catheter tubing was secured at the right thigh and the lytic therapy was initiated. The patient tolerated the procedure well without immediate postprocedural complication. FINDINGS: Limited central pulmonary arteriogram demonstrates enlargement of the main, right and left pulmonary arteries with near occlusive filling defect seen bilaterally compatible with findings seen on preceding chest CTA. Acquired pressure measurements: Main pulmonary artery-61/32; mean-44 (normal: < 25/10) Following the procedure, both ultrasound  assisted infusion catheter tips terminate within the distal aspects of the bilateral lower lobe sub segmental pulmonary arteries. IMPRESSION: 1. Successful fluoroscopic guided initiation of bilateral ultrasound assisted catheter directed pulmonary arterial lysis for sub massive pulmonary embolism and right-sided heart strain. 2. Elevated pressure measurements within the main pulmonary artery compatible with critical pulmonary arterial hypertension. PLAN: - continued monitoring and management  per the ICU staff. - morning chest radiograph with bedside pulmonary arterial pressure measurements and catheter removal following 12 hour lytic infusion. Electronically Signed   By: Sandi Mariscal M.D.   On: 09/13/2019 08:42   IR Angiogram Selective Each Additional Vessel  Result Date: 09/13/2019 INDICATION: Sub massive pulmonary embolism. Patient presents for initiation of bilateral pulmonary arterial catheter directed thrombolysis. EXAM: 1. ULTRASOUND GUIDANCE FOR VENOUS ACCESS X2 2. PULMONARY ARTERIOGRAPHY 3. FLUOROSCOPIC GUIDED PLACEMENT OF BILATERAL PULMONARY ARTERIAL LYTIC INFUSION CATHETERS COMPARISON:  Chest CTA-earlier same day MEDICATIONS: None ANESTHESIA/SEDATION: Moderate (conscious) sedation was employed during this procedure. A total of Versed 1 mg and Fentanyl 100 mcg was administered intravenously. Moderate Sedation Time: 35 minutes. The patient's level of consciousness and vital signs were monitored continuously by radiology nursing throughout the procedure under my direct supervision. CONTRAST:  34m OMNIPAQUE IOHEXOL 300 MG/ML  SOLN FLUOROSCOPY TIME:  9 minutes, 12 seconds (96 mGy) COMPLICATIONS: None immediate. TECHNIQUE: Informed written consent was obtained from the patient after a discussion of the risks, benefits and alternatives to treatment. Questions regarding the procedure were encouraged and answered. A timeout was performed prior to the initiation of the procedure. Ultrasound scanning was  performed of the right groin and demonstrated wide patency of the right common femoral vein. As such, the right common femoral vein was selected venous access. The right groin was prepped and draped in the usual sterile fashion, and a sterile drape was applied covering the operative field. Maximum barrier sterile technique with sterile gowns and gloves were used for the procedure. A timeout was performed prior to the initiation of the procedure. Local anesthesia was provided with 1% lidocaine. Under direct ultrasound guidance, the right common femoral vein was accessed with a micro puncture kit ultimately allowing placement of a 6 French, 35 cm vascular sheath. Slightly cranial to this initial access, the right common femoral was again accessed with an additional micropuncture kit ultimately allowing placement of an additional 6 French, 35 cm vascular sheath. Ultrasound images were saved for procedural documentation purposes. With the use of a glidewire, a vertebral catheter was advanced into the main pulmonary artery and a limited central pulmonary arteriogram was performed. Pressure measurements were then obtained from the main pulmonary artery. The vertebral catheter was advanced into the distal branch of the right lower lobe pulmonary artery. Limited contrast injection confirmed appropriate positioning. Over an exchange length roadrunner wire, the vertebral catheter was exchanged for a 90/15 cm multi side-hole infusion catheter. With the use of a glidewire, a vertebral catheter was advanced into a distal branch of the left lower lobe pulmonary artery. Limited contrast injection confirmed appropriate positioning. Over an exchange length roadrunner wire, the pigtail catheter was exchanged for a 90/10 cm multi side-hole EKOS ultrasound assisted infusion catheter. A postprocedural fluoroscopic image was obtained to document final catheter positioning. The external catheter tubing was secured at the right thigh and  the lytic therapy was initiated. The patient tolerated the procedure well without immediate postprocedural complication. FINDINGS: Limited central pulmonary arteriogram demonstrates enlargement of the main, right and left pulmonary arteries with near occlusive filling defect seen bilaterally compatible with findings seen on preceding chest CTA. Acquired pressure measurements: Main pulmonary artery-61/32; mean-44 (normal: < 25/10) Following the procedure, both ultrasound assisted infusion catheter tips terminate within the distal aspects of the bilateral lower lobe sub segmental pulmonary arteries. IMPRESSION: 1. Successful fluoroscopic guided initiation of bilateral ultrasound assisted catheter directed pulmonary arterial lysis for sub massive pulmonary embolism and right-sided heart  strain. 2. Elevated pressure measurements within the main pulmonary artery compatible with critical pulmonary arterial hypertension. PLAN: - continued monitoring and management per the ICU staff. - morning chest radiograph with bedside pulmonary arterial pressure measurements and catheter removal following 12 hour lytic infusion. Electronically Signed   By: Sandi Mariscal M.D.   On: 09/13/2019 08:42   IR US Guide Vasc Access Right  Result Date: 09/13/2019 INDICATION: Sub massive pulmonary embolism. Patient presents for initiation of bilateral pulmonary arterial catheter directed thrombolysis. EXAM: 1. ULTRASOUND GUIDANCE FOR VENOUS ACCESS X2 2. PULMONARY ARTERIOGRAPHY 3. FLUOROSCOPIC GUIDED PLACEMENT OF BILATERAL PULMONARY ARTERIAL LYTIC INFUSION CATHETERS COMPARISON:  Chest CTA-earlier same day MEDICATIONS: None ANESTHESIA/SEDATION: Moderate (conscious) sedation was employed during this procedure. A total of Versed 1 mg and Fentanyl 100 mcg was administered intravenously. Moderate Sedation Time: 35 minutes. The patient's level of consciousness and vital signs were monitored continuously by radiology nursing throughout the procedure  under my direct supervision. CONTRAST:  46m OMNIPAQUE IOHEXOL 300 MG/ML  SOLN FLUOROSCOPY TIME:  9 minutes, 12 seconds (96 mGy) COMPLICATIONS: None immediate. TECHNIQUE: Informed written consent was obtained from the patient after a discussion of the risks, benefits and alternatives to treatment. Questions regarding the procedure were encouraged and answered. A timeout was performed prior to the initiation of the procedure. Ultrasound scanning was performed of the right groin and demonstrated wide patency of the right common femoral vein. As such, the right common femoral vein was selected venous access. The right groin was prepped and draped in the usual sterile fashion, and a sterile drape was applied covering the operative field. Maximum barrier sterile technique with sterile gowns and gloves were used for the procedure. A timeout was performed prior to the initiation of the procedure. Local anesthesia was provided with 1% lidocaine. Under direct ultrasound guidance, the right common femoral vein was accessed with a micro puncture kit ultimately allowing placement of a 6 French, 35 cm vascular sheath. Slightly cranial to this initial access, the right common femoral was again accessed with an additional micropuncture kit ultimately allowing placement of an additional 6 French, 35 cm vascular sheath. Ultrasound images were saved for procedural documentation purposes. With the use of a glidewire, a vertebral catheter was advanced into the main pulmonary artery and a limited central pulmonary arteriogram was performed. Pressure measurements were then obtained from the main pulmonary artery. The vertebral catheter was advanced into the distal branch of the right lower lobe pulmonary artery. Limited contrast injection confirmed appropriate positioning. Over an exchange length roadrunner wire, the vertebral catheter was exchanged for a 90/15 cm multi side-hole infusion catheter. With the use of a glidewire, a  vertebral catheter was advanced into a distal branch of the left lower lobe pulmonary artery. Limited contrast injection confirmed appropriate positioning. Over an exchange length roadrunner wire, the pigtail catheter was exchanged for a 90/10 cm multi side-hole EKOS ultrasound assisted infusion catheter. A postprocedural fluoroscopic image was obtained to document final catheter positioning. The external catheter tubing was secured at the right thigh and the lytic therapy was initiated. The patient tolerated the procedure well without immediate postprocedural complication. FINDINGS: Limited central pulmonary arteriogram demonstrates enlargement of the main, right and left pulmonary arteries with near occlusive filling defect seen bilaterally compatible with findings seen on preceding chest CTA. Acquired pressure measurements: Main pulmonary artery-61/32; mean-44 (normal: < 25/10) Following the procedure, both ultrasound assisted infusion catheter tips terminate within the distal aspects of the bilateral lower lobe sub segmental pulmonary arteries. IMPRESSION:  1. Successful fluoroscopic guided initiation of bilateral ultrasound assisted catheter directed pulmonary arterial lysis for sub massive pulmonary embolism and right-sided heart strain. 2. Elevated pressure measurements within the main pulmonary artery compatible with critical pulmonary arterial hypertension. PLAN: - continued monitoring and management per the ICU staff. - morning chest radiograph with bedside pulmonary arterial pressure measurements and catheter removal following 12 hour lytic infusion. Electronically Signed   By: Sandi Mariscal M.D.   On: 09/13/2019 08:42   DG Chest Port 1 View  Result Date: 09/13/2019 CLINICAL DATA:  Pulmonary artery catheters EXAM: PORTABLE CHEST 1 VIEW COMPARISON:  Yesterday FINDINGS: Bilateral pulmonary artery infusion catheters. The right-sided catheter tip is 3 cm higher than yesterday. The left-sided catheter is  unchanged. No visible adjacent pulmonary hemorrhage. Chronic cardiomegaly. Mild streaky opacity asymmetric to the left. Postoperative thoracic inlet. IMPRESSION: Mild shortening of the right-sided pulmonary artery catheter. Electronically Signed   By: Monte Fantasia M.D.   On: 09/13/2019 08:48   DG Chest Port 1 View  Result Date: 09/12/2019 CLINICAL DATA:  Evaluate pulmonary catheter placement EXAM: PORTABLE CHEST 1 VIEW COMPARISON:  Film from earlier in the same day. FINDINGS: Cardiac shadow is stable. The lungs are clear bilaterally. Infusion catheters are now noted in the pulmonary arteries bilaterally. No sizable effusion or infiltrate is seen. Postsurgical changes in the left neck are noted. IMPRESSION: Pulmonary arterial infusion catheters bilaterally. No acute abnormality noted. Electronically Signed   By: Inez Catalina M.D.   On: 09/12/2019 20:36   DG Chest Port 1 View  Result Date: 09/12/2019 CLINICAL DATA:  Shortness of breath. EXAM: PORTABLE CHEST 1 VIEW COMPARISON:  None. FINDINGS: The heart size and mediastinal contours are within normal limits. Both lungs are clear. No pneumothorax or pleural effusion is noted. The visualized skeletal structures are unremarkable. IMPRESSION: No active disease. Electronically Signed   By: Marijo Conception M.D.   On: 09/12/2019 14:43   ECHOCARDIOGRAM COMPLETE  Result Date: 09/13/2019    ECHOCARDIOGRAM REPORT   Patient Name:   Julia Gomez Date of Exam: 09/13/2019 Medical Rec #:  [id number]     Height:       66.5 in Accession #:    [id number]    Weight:       227.7 lb Date of Birth:  [birth year]     BSA:          2.125 m Patient Age:    69 years      BP:           149/85 mmHg Patient Gender: F             HR:           102 bpm. Exam Location:  Inpatient Procedure: 2D Echo, Cardiac Doppler and Color Doppler STAT ECHO Indications:    Pulmonary Embolus 415.19 / I26.99  History:        Patient has no prior history of Echocardiogram examinations.                 Risk  Factors:Non-Smoker.  Sonographer:    Vickie Epley RDCS Referring Phys: [place]  1. Positive McConnell's sign is present. Acute RV failure consistent with submassive PE is present. Right ventricular systolic function is moderately reduced. The right ventricular size is severely enlarged. There is normal pulmonary artery systolic pressure. The estimated right ventricular systolic pressure is 76.8 mmHg.  2. Left ventricular ejection fraction, by estimation, is 60 to 65%. The left ventricle  has normal function. The left ventricle has no regional wall motion abnormalities. Left ventricular diastolic parameters were normal. There is the interventricular septum is flattened in systole and diastole, consistent with right ventricular pressure and volume overload.  3. Left atrial size was mildly dilated.  4. Moderate pericardial effusion. The pericardial effusion is posterior to the left ventricle. There is no evidence of cardiac tamponade.  5. The mitral valve is grossly normal. Trivial mitral valve regurgitation. No evidence of mitral stenosis.  6. The aortic valve is tricuspid. Aortic valve regurgitation is not visualized. Mild aortic valve sclerosis is present, with no evidence of aortic valve stenosis.  7. The inferior vena cava is normal in size with <50% respiratory variability, suggesting right atrial pressure of 8 mmHg. FINDINGS  Left Ventricle: Left ventricular ejection fraction, by estimation, is 60 to 65%. The left ventricle has normal function. The left ventricle has no regional wall motion abnormalities. The left ventricular internal cavity size was normal in size. There is  no left ventricular hypertrophy. The interventricular septum is flattened in systole and diastole, consistent with right ventricular pressure and volume overload. Left ventricular diastolic parameters were normal. Right Ventricle: Positive McConnell's sign is present. Acute RV failure consistent with submassive PE is  present. The right ventricular size is severely enlarged. No increase in right ventricular wall thickness. Right ventricular systolic function is moderately reduced. There is normal pulmonary artery systolic pressure. The tricuspid regurgitant velocity is 2.62 m/s, and with an assumed right atrial pressure of 8 mmHg, the estimated right ventricular systolic pressure is 61.9 mmHg. Left Atrium: Left atrial size was mildly dilated. Right Atrium: Right atrial size was normal in size. Pericardium: A moderately sized pericardial effusion is present. The pericardial effusion is posterior to the left ventricle. There is no evidence of cardiac tamponade. Presence of pericardial fat pad. Mitral Valve: The mitral valve is grossly normal. Trivial mitral valve regurgitation. No evidence of mitral valve stenosis. Tricuspid Valve: The tricuspid valve is grossly normal. Tricuspid valve regurgitation is mild . No evidence of tricuspid stenosis. Aortic Valve: The aortic valve is tricuspid. . There is mild thickening and mild calcification of the aortic valve. Aortic valve regurgitation is not visualized. Mild aortic valve sclerosis is present, with no evidence of aortic valve stenosis. There is mild thickening of the aortic valve. There is mild calcification of the aortic valve. Pulmonic Valve: The pulmonic valve was grossly normal. Pulmonic valve regurgitation is not visualized. No evidence of pulmonic stenosis. Aorta: The aortic root is normal in size and structure. Venous: The inferior vena cava is normal in size with less than 50% respiratory variability, suggesting right atrial pressure of 8 mmHg. IAS/Shunts: The atrial septum is grossly normal. Additional Comments: A venous catheter is visualized in the inferior vena cava, right atrium and right ventricle.  LEFT VENTRICLE PLAX 2D LVIDd:         4.10 cm     Diastology LVIDs:         2.90 cm     LV e' lateral: 9.46 cm/s LV PW:         0.70 cm     LV e' medial:  9.46 cm/s LV IVS:         0.80 cm LVOT diam:     1.90 cm LV SV:         47 LV SV Index:   22 LVOT Area:     2.84 cm  LV Volumes (MOD) LV vol d, MOD A2C: 54.8  ml LV vol d, MOD A4C: 52.0 ml LV vol s, MOD A2C: 21.9 ml LV vol s, MOD A4C: 30.8 ml LV SV MOD A2C:     32.9 ml LV SV MOD A4C:     52.0 ml LV SV MOD BP:      27.9 ml RIGHT VENTRICLE TAPSE (M-mode): 1.7 cm LEFT ATRIUM             Index      RIGHT ATRIUM           Index LA diam:        3.20 cm 1.51 cm/m RA Area:     17.20 cm LA Vol (A2C):   21.0 ml 9.88 ml/m RA Volume:   48.30 ml  22.73 ml/m LA Vol (A4C):   20.3 ml 9.55 ml/m LA Biplane Vol: 20.8 ml 9.79 ml/m  AORTIC VALVE LVOT Vmax:   96.80 cm/s LVOT Vmean:  67.900 cm/s LVOT VTI:    0.165 m  AORTA Ao Root diam: 2.60 cm TRICUSPID VALVE TR Peak grad:   27.5 mmHg TR Vmax:        262.00 cm/s  SHUNTS Systemic VTI:  0.16 m Systemic Diam: 1.90 cm Eleonore Chiquito MD Electronically signed by Eleonore Chiquito MD Signature Date/Time: 09/13/2019/10:44:57 AM    Final    VAS [place] LOWER EXTREMITY VENOUS (DVT)  Result Date: 09/14/2019  Lower Venous DVTStudy Indications: Pulmonary embolism.  Performing Technologist: Abram Sander RVS  Examination Guidelines: A complete evaluation includes B-mode imaging, spectral Doppler, color Doppler, and power Doppler as needed of all accessible portions of each vessel. Bilateral testing is considered an integral part of a complete examination. Limited examinations for reoccurring indications may be performed as noted. The reflux portion of the exam is performed with the patient in reverse Trendelenburg.  +---------+---------------+---------+-----------+----------+--------------+ RIGHT    CompressibilityPhasicitySpontaneityPropertiesThrombus Aging +---------+---------------+---------+-----------+----------+--------------+ CFV      Full           Yes      Yes                                 +---------+---------------+---------+-----------+----------+--------------+ SFJ      Full                                                         +---------+---------------+---------+-----------+----------+--------------+ FV Prox  Full                                                        +---------+---------------+---------+-----------+----------+--------------+ FV Mid   Full                                                        +---------+---------------+---------+-----------+----------+--------------+ FV DistalFull                                                        +---------+---------------+---------+-----------+----------+--------------+  PFV      Full                                                        +---------+---------------+---------+-----------+----------+--------------+ POP      Full           Yes      Yes                                 +---------+---------------+---------+-----------+----------+--------------+ PTV      Full                                                        +---------+---------------+---------+-----------+----------+--------------+ PERO     Full                                                        +---------+---------------+---------+-----------+----------+--------------+   +---------+---------------+---------+-----------+----------+--------------+ LEFT     CompressibilityPhasicitySpontaneityPropertiesThrombus Aging +---------+---------------+---------+-----------+----------+--------------+ CFV      Full           Yes      Yes                                 +---------+---------------+---------+-----------+----------+--------------+ SFJ      Full                                                        +---------+---------------+---------+-----------+----------+--------------+ FV Prox  Full                                                        +---------+---------------+---------+-----------+----------+--------------+ FV Mid   Full                                                         +---------+---------------+---------+-----------+----------+--------------+ FV DistalFull                                                        +---------+---------------+---------+-----------+----------+--------------+ PFV      Full                                                        +---------+---------------+---------+-----------+----------+--------------+  POP      None           No       No                   Acute          +---------+---------------+---------+-----------+----------+--------------+ PTV      None                                         Acute          +---------+---------------+---------+-----------+----------+--------------+ PERO     None                                         Acute          +---------+---------------+---------+-----------+----------+--------------+     Summary: RIGHT: - No evidence of deep vein thrombosis in the lower extremity. No indirect evidence of obstruction proximal to the inguinal ligament.  LEFT: - Findings consistent with acute deep vein thrombosis involving the left popliteal vein, left posterior tibial veins, and left peroneal veins. - No cystic structure found in the popliteal fossa.  *See table(s) above for measurements and observations. Electronically signed by Monica Martinez MD on 09/14/2019 at 3:10:59 PM.    Final    IR INFUSION THROMBOL ARTERIAL INITIAL (MS)  Result Date: 09/13/2019 INDICATION: Sub massive pulmonary embolism. Patient presents for initiation of bilateral pulmonary arterial catheter directed thrombolysis. EXAM: 1. ULTRASOUND GUIDANCE FOR VENOUS ACCESS X2 2. PULMONARY ARTERIOGRAPHY 3. FLUOROSCOPIC GUIDED PLACEMENT OF BILATERAL PULMONARY ARTERIAL LYTIC INFUSION CATHETERS COMPARISON:  Chest CTA-earlier same day MEDICATIONS: None ANESTHESIA/SEDATION: Moderate (conscious) sedation was employed during this procedure. A total of Versed 1 mg and Fentanyl 100 mcg was administered intravenously. Moderate  Sedation Time: 35 minutes. The patient's level of consciousness and vital signs were monitored continuously by radiology nursing throughout the procedure under my direct supervision. CONTRAST:  48m OMNIPAQUE IOHEXOL 300 MG/ML  SOLN FLUOROSCOPY TIME:  9 minutes, 12 seconds (96 mGy) COMPLICATIONS: None immediate. TECHNIQUE: Informed written consent was obtained from the patient after a discussion of the risks, benefits and alternatives to treatment. Questions regarding the procedure were encouraged and answered. A timeout was performed prior to the initiation of the procedure. Ultrasound scanning was performed of the right groin and demonstrated wide patency of the right common femoral vein. As such, the right common femoral vein was selected venous access. The right groin was prepped and draped in the usual sterile fashion, and a sterile drape was applied covering the operative field. Maximum barrier sterile technique with sterile gowns and gloves were used for the procedure. A timeout was performed prior to the initiation of the procedure. Local anesthesia was provided with 1% lidocaine. Under direct ultrasound guidance, the right common femoral vein was accessed with a micro puncture kit ultimately allowing placement of a 6 French, 35 cm vascular sheath. Slightly cranial to this initial access, the right common femoral was again accessed with an additional micropuncture kit ultimately allowing placement of an additional 6 French, 35 cm vascular sheath. Ultrasound images were saved for procedural documentation purposes. With the use of a glidewire, a vertebral catheter was advanced into the main pulmonary artery and a limited central pulmonary arteriogram was performed. Pressure measurements were then obtained from the main pulmonary  artery. The vertebral catheter was advanced into the distal branch of the right lower lobe pulmonary artery. Limited contrast injection confirmed appropriate positioning. Over an  exchange length roadrunner wire, the vertebral catheter was exchanged for a 90/15 cm multi side-hole infusion catheter. With the use of a glidewire, a vertebral catheter was advanced into a distal branch of the left lower lobe pulmonary artery. Limited contrast injection confirmed appropriate positioning. Over an exchange length roadrunner wire, the pigtail catheter was exchanged for a 90/10 cm multi side-hole EKOS ultrasound assisted infusion catheter. A postprocedural fluoroscopic image was obtained to document final catheter positioning. The external catheter tubing was secured at the right thigh and the lytic therapy was initiated. The patient tolerated the procedure well without immediate postprocedural complication. FINDINGS: Limited central pulmonary arteriogram demonstrates enlargement of the main, right and left pulmonary arteries with near occlusive filling defect seen bilaterally compatible with findings seen on preceding chest CTA. Acquired pressure measurements: Main pulmonary artery-61/32; mean-44 (normal: < 25/10) Following the procedure, both ultrasound assisted infusion catheter tips terminate within the distal aspects of the bilateral lower lobe sub segmental pulmonary arteries. IMPRESSION: 1. Successful fluoroscopic guided initiation of bilateral ultrasound assisted catheter directed pulmonary arterial lysis for sub massive pulmonary embolism and right-sided heart strain. 2. Elevated pressure measurements within the main pulmonary artery compatible with critical pulmonary arterial hypertension. PLAN: - continued monitoring and management per the ICU staff. - morning chest radiograph with bedside pulmonary arterial pressure measurements and catheter removal following 12 hour lytic infusion. Electronically Signed   By: Sandi Mariscal M.D.   On: 09/13/2019 08:42   IR THROMB F/U EVAL ART/VEN FINAL DAY (MS)  Result Date: 09/13/2019 INDICATION: 69 year old female with a history of submassive pulmonary  embolism, status post catheter directed lysis EXAM: FOLLOW-UP PULMONARY ARTERY EMBOLISM LYSIS COMPARISON:  CT 09/12/2019, initial angiogram 09/12/2019 MEDICATIONS: None. ANESTHESIA/SEDATION: None FLUOROSCOPY TIME:  None COMPLICATIONS: None TECHNIQUE: Informed written consent was obtained from the patient after a thorough discussion of the procedural risks, benefits and alternatives. All questions were addressed. Maximal Sterile Barrier Technique was utilized including caps, mask, sterile gowns, sterile gloves, sterile drape, hand hygiene and skin antiseptic. A timeout was performed prior to the initiation of the procedure. Clean procedure was used to do bedside trans duction of the right pulmonary artery lytic catheter. Once the pressure measurement was recorded, all catheters and sheaths were removed. Manual pressure was used for hemostasis and clean bandages were placed. Patient tolerated the procedure well and remained hemodynamically stable throughout. No subsequent blood loss. FINDINGS: Initial pressure measurement on yesterday's study main pulmonary artery: 61/32 (44) Follow-up pressure on today's study main pulmonary artery: 30/12 (19) IMPRESSION: Follow-up pulmonary artery lysis demonstrates reduction in the main pulmonary artery measured pressures, status post bilateral pulmonary artery catheter directed lysis. Signed, Dulcy Fanny. Dellia Nims, RPVI [facility] Electronically Signed   By: Corrie Mckusick D.O.   On: 09/13/2019 11:59    Labs:  CBC: Recent Labs    09/13/19 1850 09/13/19 1850 09/14/19 0102 09/14/19 0306 09/14/19 1649 09/15/19 0147  WBC 9.4  --   --  8.6 7.9 8.0  HGB 11.7*   < > 10.8* 10.2* 9.7* 8.9*  HCT 36.8   < > 33.7* 32.3* 30.6* 28.5*  PLT 107*  --   --  102* 119* 131*   < > = values in this interval not displayed.    COAGS: Recent Labs    09/12/19 1755  INR 1.1  APTT 27    BMP: Recent Labs    09/12/19 1256  09/13/19 0517  NA 141 138  K 4.4 5.1  CL 108 110  CO2 22 22  GLUCOSE 147* 122*  BUN 15 13  CALCIUM 8.1* 7.1*  CREATININE 1.34* 0.86  GFRNONAA 41* >60  GFRAA 47* >60    LIVER FUNCTION TESTS: No results for input(s): BILITOT, AST, ALT, ALKPHOS, PROT, ALBUMIN in the last 8760 hours.  Assessment and Plan:  Submassive PE, s/p bilateral catheter directed pulmonary arterial thrombolysis by Dr. Pascal Lux 09/12/19: Hemoglobin has dropped slightly compared to yesterday (8.9 today, 9.7 yesterday). Bedside RN reports that the dressing was last changed around 4 am today. The dressing is currently clean and dry with no evidence of bleeding.   IR will sign off for now but will be available should future needs arise.   Electronically Signed: Soyla Dryer, AGACNP-BC [phone] 09/15/2019, 10:18 AM   I spent a total of 15 Minutes at the the patient's bedside AND on the patient's hospital floor or unit, greater than 50% of which was counseling/coordinating care for catheter directed pulmonary arterial thrombolysis post-care.

## 2019-09-15 NOTE — Plan of Care (Signed)
  Problem: Education: Goal: Knowledge of General Education information will improve Description Including pain rating scale, medication(s)/side effects and non-pharmacologic comfort measures Outcome: Progressing   

## 2019-09-15 NOTE — Progress Notes (Signed)
PROGRESS NOTE    Julia Gomez  GEZ:[id number] DOB: 27-May-1950 DOA: 09/12/2019 PCP: Patient, No Pcp Per    Brief Narrative: PCCM pickup 09/15/2019 patient was admitted 09/12/2019.  70 year old female admitted with submassive PE status post catheter-based thrombolysis by interventional radiology.  On heparin drip. Work-up included echocardiogram with right ventricular failure and hypokinesis Left lower extremity extensive DVT Head CT left posterior convexity scalp hematoma without underlying skull fracture. Cervical CT no acute traumatic injury noted. Covid negative blood culture negative CT angiogram with acute PE with a large saddle embolism. Assessment & Plan:   Active Problems:   PE (pulmonary thromboembolism) (HCC)   Pulmonary embolism (HCC)   #1 acute pulmonary embolism/submassive PE status post catheter guided thrombolysis by IR.  Currently on heparin drip.  Patient had some oozing from the right groin site yesterday improved after applying topical tranexamic acid. Continue heparin to transition to DOAC once bleeding has resolved  #2 AKI resolved with IV hydration follow-up labs in a.m.  #3 acute blood loss anemia hemoglobin slowly trending down.  Hemoglobin 8.9 from 13.3 at the time of admission.  #4 left lower extremity extensive DVT on heparin  #5 history of depression continue Prozac  #6 hyperlipidemia on Lipitor 20 mg daily.    Estimated body mass index is 34.77 kg/m as calculated from the following:   Height as of this encounter: 5' 6.5" (1.689 m).   Weight as of this encounter: 99.2 kg.  DVT prophylaxis: Heparin Code Status: Full code Family Communication: None at bedside  disposition Plan:  Status is: Inpatient   Dispo: The patient is from: Home              Anticipated d/c is to: Home              Anticipated d/c date is: 2 days              Patient currently is not medically stable to d/c.  Patient admitted with acute submassive PE status post catheter  directed thrombolysis on IV heparin.    Consultants:   PCCM and interventional radiology  Procedures: Thrombolysis 09/13/2019 Antimicrobials: None Subjective: Patient resting in bed on room air has not gotten out of bed since she came breathing better had diarrhea at the time of admission which has been resolving.  No chest pain shortness of breath nausea vomiting.  Had some oozing from the catheter site overnight which is resolving with topical tranexamic acid.  Objective: Vitals:   09/15/19 0700 09/15/19 0800 09/15/19 0900 09/15/19 1000  BP: (!) 96/58 (!) 93/56 98/85 (!) 104/56  Pulse: 83 85 94 92  Resp: 18 19 15  (!) 21  Temp: 98.9 F (37.2 C)     TempSrc: Oral     SpO2: 93% 92% 93% 92%  Weight:      Height:        Intake/Output Summary (Last 24 hours) at 09/15/2019 1045 Last data filed at 09/15/2019 0900 Gross per 24 hour  Intake 2667.32 ml  Output 280 ml  Net 2387.32 ml   Filed Weights   09/13/19 0500 09/14/19 0500 09/15/19 0500  Weight: 103.3 kg 99.5 kg 99.2 kg    Examination:  General exam: Appears calm and comfortable  Respiratory system: Clear to auscultation. Respiratory effort normal. Cardiovascular system: S1 & S2 heard, RRR. No JVD, murmurs, rubs, gallops or clicks. No pedal edema. Gastrointestinal system: Abdomen is nondistended, soft and nontender. No organomegaly or masses felt. Normal bowel sounds heard. Central nervous system:  Alert and oriented. No focal neurological deficits. Extremities: Bilateral lower extremity edema left more than right Skin: No rashes, lesions or ulcers Psychiatry: Judgement and insight appear normal. Mood & affect appropriate.     Data Reviewed: I have personally reviewed following labs and imaging studies  CBC: Recent Labs  Lab 09/13/19 0517 09/13/19 0517 09/13/19 1850 09/14/19 0102 09/14/19 0306 09/14/19 1649 09/15/19 0147  WBC 9.6  --  9.4  --  8.6 7.9 8.0  HGB 11.5*   < > 11.7* 10.8* 10.2* 9.7* 8.9*  HCT 37.1    < > 36.8 33.7* 32.3* 30.6* 28.5*  MCV 94.2  --  92.5  --  92.6 92.4 92.8  PLT 106*  --  107*  --  102* 119* 131*   < > = values in this interval not displayed.   Basic Metabolic Panel: Recent Labs  Lab 09/12/19 1256 09/13/19 0517  NA 141 138  K 4.4 5.1  CL 108 110  CO2 22 22  GLUCOSE 147* 122*  BUN 15 13  CREATININE 1.34* 0.86  CALCIUM 8.1* 7.1*  MG  --  2.0  PHOS  --  2.8   GFR: Estimated Creatinine Clearance: 76.2 mL/min (by C-G formula based on SCr of 0.86 mg/dL). Liver Function Tests: No results for input(s): AST, ALT, ALKPHOS, BILITOT, PROT, ALBUMIN in the last 168 hours. No results for input(s): LIPASE, AMYLASE in the last 168 hours. No results for input(s): AMMONIA in the last 168 hours. Coagulation Profile: Recent Labs  Lab 09/12/19 1755  INR 1.1   Cardiac Enzymes: No results for input(s): CKTOTAL, CKMB, CKMBINDEX, TROPONINI in the last 168 hours. BNP (last 3 results) No results for input(s): PROBNP in the last 8760 hours. HbA1C: Recent Labs    09/12/19 1757  HGBA1C 6.1*   CBG: Recent Labs  Lab 09/12/19 2207  GLUCAP 97   Lipid Profile: Recent Labs    09/12/19 1256  TRIG 112   Thyroid Function Tests: No results for input(s): TSH, T4TOTAL, FREET4, T3FREE, THYROIDAB in the last 72 hours. Anemia Panel: Recent Labs    09/12/19 1400  FERRITIN 270   Sepsis Labs: Recent Labs  Lab 09/12/19 1256 09/12/19 1400 09/12/19 2301  PROCALCITON <0.10  --   --   LATICACIDVEN  --  1.8 1.7    Recent Results (from the past 240 hour(s))  Blood Culture (routine x 2)     Status: None (Preliminary result)   Collection Time: 09/12/19  1:40 PM   Specimen: BLOOD  Result Value Ref Range Status   Specimen Description BLOOD SITE NOT SPECIFIED  Final   Special Requests   Final    BOTTLES DRAWN AEROBIC ONLY Blood Culture adequate volume   Culture   Final    NO GROWTH 3 DAYS Performed at [facility], [address] [place], [place] [address]    Report  Status PENDING  Incomplete  SARS Coronavirus 2 by RT PCR (hospital order, performed in [facility] hospital lab) Nasopharyngeal Nasopharyngeal Swab     Status: None   Collection Time: 09/12/19  2:00 PM   Specimen: Nasopharyngeal Swab  Result Value Ref Range Status   SARS Coronavirus 2 NEGATIVE NEGATIVE Final    Comment: (NOTE) SARS-CoV-2 target nucleic acids are NOT DETECTED.  The SARS-CoV-2 RNA is generally detectable in upper and lower respiratory specimens during the acute phase of infection. The lowest concentration of SARS-CoV-2 viral copies this assay can detect is 250 copies / mL. A negative  result does not preclude SARS-CoV-2 infection and should not be used as the sole basis for treatment or other patient management decisions.  A negative result may occur with improper specimen collection / handling, submission of specimen other than nasopharyngeal swab, presence of viral mutation(s) within the areas targeted by this assay, and inadequate number of viral copies (<250 copies / mL). A negative result must be combined with clinical observations, patient history, and epidemiological information.  Fact Sheet for Patients:   BoilerBrush.com.cy  Fact Sheet for Healthcare Providers: https://pope.com/  This test is not yet approved or  cleared by the [place] FDA and has been authorized for detection and/or diagnosis of SARS-CoV-2 by FDA under an Emergency Use Authorization (EUA).  This EUA will remain in effect (meaning this test can be used) for the duration of the COVID-19 declaration under Section 564(b)(1) of the Act, 21 U.S.C. section 360bbb-3(b)(1), unless the authorization is terminated or revoked sooner.  Performed at [facility], [address] [place], [place] [address]   MRSA PCR Screening     Status: None   Collection Time: 09/12/19 10:00 PM   Specimen: Nasal Mucosa; Nasopharyngeal  Result Value Ref  Range Status   MRSA by PCR NEGATIVE NEGATIVE Final    Comment:        The GeneXpert MRSA Assay (FDA approved for NASAL specimens only), is one component of a comprehensive MRSA colonization surveillance program. It is not intended to diagnose MRSA infection nor to guide or monitor treatment for MRSA infections. Performed at [facility], [address], [place], [place] [address]   Blood Culture (routine x 2)     Status: None (Preliminary result)   Collection Time: 09/13/19 12:37 AM   Specimen: BLOOD LEFT ARM  Result Value Ref Range Status   Specimen Description BLOOD LEFT ARM  Final   Special Requests   Final    BOTTLES DRAWN AEROBIC AND ANAEROBIC Blood Culture results may not be optimal due to an excessive volume of blood received in culture bottles   Culture   Final    NO GROWTH 2 DAYS Performed at [facility], [address] [place], [place] [address]    Report Status PENDING  Incomplete         Radiology Studies: VAS [place] LOWER EXTREMITY VENOUS (DVT)  Result Date: 09/14/2019  Lower Venous DVTStudy Indications: Pulmonary embolism.  Performing Technologist: Blanch Media RVS  Examination Guidelines: A complete evaluation includes B-mode imaging, spectral Doppler, color Doppler, and power Doppler as needed of all accessible portions of each vessel. Bilateral testing is considered an integral part of a complete examination. Limited examinations for reoccurring indications may be performed as noted. The reflux portion of the exam is performed with the patient in reverse Trendelenburg.  +---------+---------------+---------+-----------+----------+--------------+ RIGHT    CompressibilityPhasicitySpontaneityPropertiesThrombus Aging +---------+---------------+---------+-----------+----------+--------------+ CFV      Full           Yes      Yes                                 +---------+---------------+---------+-----------+----------+--------------+ SFJ       Full                                                        +---------+---------------+---------+-----------+----------+--------------+  FV Prox  Full                                                        +---------+---------------+---------+-----------+----------+--------------+ FV Mid   Full                                                        +---------+---------------+---------+-----------+----------+--------------+ FV DistalFull                                                        +---------+---------------+---------+-----------+----------+--------------+ PFV      Full                                                        +---------+---------------+---------+-----------+----------+--------------+ POP      Full           Yes      Yes                                 +---------+---------------+---------+-----------+----------+--------------+ PTV      Full                                                        +---------+---------------+---------+-----------+----------+--------------+ PERO     Full                                                        +---------+---------------+---------+-----------+----------+--------------+   +---------+---------------+---------+-----------+----------+--------------+ LEFT     CompressibilityPhasicitySpontaneityPropertiesThrombus Aging +---------+---------------+---------+-----------+----------+--------------+ CFV      Full           Yes      Yes                                 +---------+---------------+---------+-----------+----------+--------------+ SFJ      Full                                                        +---------+---------------+---------+-----------+----------+--------------+ FV Prox  Full                                                        +---------+---------------+---------+-----------+----------+--------------+  FV Mid   Full                                                         +---------+---------------+---------+-----------+----------+--------------+ FV DistalFull                                                        +---------+---------------+---------+-----------+----------+--------------+ PFV      Full                                                        +---------+---------------+---------+-----------+----------+--------------+ POP      None           No       No                   Acute          +---------+---------------+---------+-----------+----------+--------------+ PTV      None                                         Acute          +---------+---------------+---------+-----------+----------+--------------+ PERO     None                                         Acute          +---------+---------------+---------+-----------+----------+--------------+     Summary: RIGHT: - No evidence of deep vein thrombosis in the lower extremity. No indirect evidence of obstruction proximal to the inguinal ligament.  LEFT: - Findings consistent with acute deep vein thrombosis involving the left popliteal vein, left posterior tibial veins, and left peroneal veins. - No cystic structure found in the popliteal fossa.  *See table(s) above for measurements and observations. Electronically signed by Sherald Hess MD on 09/14/2019 at 3:10:59 PM.    Final         Scheduled Meds: . atorvastatin  20 mg Oral Daily  . Chlorhexidine Gluconate Cloth  6 each Topical Daily  . docusate sodium  100 mg Oral Daily  . FLUoxetine  60 mg Oral Daily  . gabapentin  300 mg Oral BID  . mouth rinse  15 mL Mouth Rinse BID  . polyethylene glycol  17 g Oral Daily  . primidone  250 mg Oral BID  . sodium chloride flush  3 mL Intravenous Q12H  . sodium chloride flush  3 mL Intravenous Q12H  . Thrombi-Pad  1 each Topical Once   Continuous Infusions: . sodium chloride    . sodium chloride    . sodium chloride Stopped (09/15/19 0515)  . sodium  chloride Stopped (09/13/19 1013)  . heparin 1,150 Units/hr (09/15/19 1018)  . lactated ringers 100 mL/hr at 09/15/19 0900     LOS: 3 days     Gardiner Ramus  Jerolyn CenterMathews, MD 09/15/2019, 10:45 AM

## 2019-09-15 NOTE — Progress Notes (Signed)
Pt noted to be bleeding through dressing on her right AC. Dressing removed, pressure applied, and new dressing placed. Will continue to assess.

## 2019-09-15 NOTE — Progress Notes (Signed)
ANTICOAGULATION CONSULT NOTE - Follow Up Consult  Pharmacy Consult for heparin  Indication: saddle pulmonary embolus  Allergies  Allergen Reactions  . Codeine Other (See Comments)    numbness    Patient Measurements: Height: 5' 6.5" (168.9 cm) Weight: 99.2 kg (218 lb 11.1 oz) IBW/kg (Calculated) : 60.45 Heparin Dosing Weight: 83.4kg  Vital Signs: Temp: 99.2 F (37.3 C) (08/27 1942) Temp Source: Oral (08/27 1600) BP: 124/55 (08/27 1942) Pulse Rate: 97 (08/27 1942)  Labs: Recent Labs    09/12/19 2301 09/13/19 0036 09/13/19 0517 09/13/19 0600 09/14/19 0306 09/14/19 1043 09/14/19 1649 09/15/19 0147 09/15/19 0752 09/15/19 1325 09/15/19 1643  HGB 12.1   < > 11.5*   < > 10.2*  --  9.7* 8.9*  --   --   --   HCT 38.5   < > 37.1   < > 32.3*  --  30.6* 28.5*  --   --   --   PLT 144*   < > 106*   < > 102*  --  119* 131*  --   --   --   HEPARINUNFRC 1.40*   < >  --    < > 0.28*   < > 0.36  --  0.29*  --  0.52  CREATININE  --   --  0.86  --   --   --   --   --   --  0.93  --   TROPONINIHS 360*  --   --   --   --   --   --   --   --   --   --    < > = values in this interval not displayed.    Estimated Creatinine Clearance: 70.4 mL/min (by C-G formula based on SCr of 0.93 mg/dL).   Assessment: 68yofemaleon heparinwith initial dosing for saddle PE, underwent EKOS 8/24 (+alteplase 12mg  x2).Heparin level was subtherapeutic today at 0.29 with heparin running at 1000 units/hour. Heparin drip increased earlier today 1150 uts/hr HL now at goal 0.52.   Nursing reports moderate bleeding after removal of an IV line last night (8/26). Bleeding from the femoral sheath site has been controlled and resolved for now. H/H is downtrended to Hgb 8.9, HCT 28.5 with PLTC of 131.  Will monitor closely  Goal of Therapy:  Heparin level 0.3-0.7 units/ml Monitor platelets by anticoagulation protocol: Yes   Plan:  Continue heparin infusion 1150 units/hr  Monitor for signs/symptoms/amount of  bleeding,  CBC, and heparin levels daily    07-10-1992 Pharm.D. CPP, BCPS Clinical Pharmacist [phone] 09/15/2019 8:25 PM

## 2019-09-16 LAB — CBC
HCT: 25.5 % — ABNORMAL LOW (ref 36.0–46.0)
Hemoglobin: 7.9 g/dL — ABNORMAL LOW (ref 12.0–15.0)
MCH: 29 pg (ref 26.0–34.0)
MCHC: 31 g/dL (ref 30.0–36.0)
MCV: 93.8 fL (ref 80.0–100.0)
Platelets: 158 10*3/uL (ref 150–400)
RBC: 2.72 MIL/uL — ABNORMAL LOW (ref 3.87–5.11)
RDW: 15.6 % — ABNORMAL HIGH (ref 11.5–15.5)
WBC: 7 10*3/uL (ref 4.0–10.5)
nRBC: 0 % (ref 0.0–0.2)

## 2019-09-16 LAB — BASIC METABOLIC PANEL
Anion gap: 8 (ref 5–15)
BUN: 11 mg/dL (ref 8–23)
CO2: 25 mmol/L (ref 22–32)
Calcium: 7.6 mg/dL — ABNORMAL LOW (ref 8.9–10.3)
Chloride: 110 mmol/L (ref 98–111)
Creatinine, Ser: 0.83 mg/dL (ref 0.44–1.00)
GFR calc Af Amer: >60 mL/min (ref >60)
GFR calc non Af Amer: >60 mL/min (ref >60)
Glucose, Bld: 110 mg/dL — ABNORMAL HIGH (ref 70–99)
Potassium: 3.8 mmol/L (ref 3.5–5.1)
Sodium: 143 mmol/L (ref 135–145)

## 2019-09-16 LAB — ABO/RH: ABO/RH(D): O POS

## 2019-09-16 LAB — PREPARE RBC (CROSSMATCH)

## 2019-09-16 LAB — HEMOGLOBIN AND HEMATOCRIT, BLOOD
HCT: 28.2 % — ABNORMAL LOW (ref 36.0–46.0)
Hemoglobin: 9.1 g/dL — ABNORMAL LOW (ref 12.0–15.0)

## 2019-09-16 LAB — HEPARIN LEVEL (UNFRACTIONATED): Heparin Unfractionated: 0.62 IU/mL (ref 0.30–0.70)

## 2019-09-16 MED ORDER — SODIUM CHLORIDE 0.9% IV SOLUTION: Freq: Once | INTRAVENOUS | Status: DC

## 2019-09-16 NOTE — Plan of Care (Signed)

## 2019-09-16 NOTE — Evaluation (Signed)
Physical Therapy Evaluation Patient Details Name: Julia Gomez MRN: [id number] DOB: [birth year] Today's Date: 09/16/2019   History of Present Illness  69 yo female with onset of posterior fall at home sustained a fracture of skull, now admitted after beginning to have progressive SOB.  Had noted submassive B PE's with LLE DVT, received pulm artery catheter directed thrombolysis, able to mitigate her clots.  IV heparin going, referred to PT.   PMHx:  asthma, depression, thyroid disease  Clinical Impression  Pt was seen for orthostatic check and noted supine 129/59, sitting 134/81, standing 127/106 and post gait 117/59.  With fluctuations is definitely going to need monitoring and will recommend CIR as pt has recently been able to travel and have a very mobile vacation.  Follow acutely for strength, control of standing and gait quality improvement along with safety of control of vitals.    Follow Up Recommendations CIR    Equipment Recommendations  None recommended by PT    Recommendations for Other Services Rehab consult     Precautions / Restrictions Precautions Precautions: Fall Precaution Comments: monitor BP and sats Restrictions Weight Bearing Restrictions: No      Mobility  Bed Mobility Overal bed mobility: Needs Assistance Bed Mobility: Supine to Sit;Sit to Supine     Supine to sit: Min assist;Mod assist Sit to supine: Mod assist   General bed mobility comments: mod to support legs onto bed  Transfers Overall transfer level: Needs assistance Equipment used: Rolling walker (2 wheeled);1 person hand held assist Transfers: Sit to/from Stand Sit to Stand: Min assist;Mod assist         General transfer comment: mod to power up and min to maintain  Ambulation/Gait Ambulation/Gait assistance: Min assist Gait Distance (Feet): 6 Feet Assistive device: Rolling walker (2 wheeled);1 person hand held assist Gait Pattern/deviations: Step-to pattern;Shuffle;Decreased  stride length;Wide base of support Gait velocity: reduced Gait velocity interpretation: <1.31 ft/sec, indicative of household ambulator General Gait Details: sidesteps on side of bed  Stairs            Wheelchair Mobility    Modified Rankin (Stroke Patients Only)       Balance Overall balance assessment: Needs assistance Sitting-balance support: Feet supported Sitting balance-Leahy Scale: Fair Sitting balance - Comments: initially light headed Postural control: Posterior lean Standing balance support: Bilateral upper extremity supported;During functional activity Standing balance-Leahy Scale: Poor Standing balance comment: poor control of standing balance with listing back to use Bed support                             Pertinent Vitals/Pain Pain Assessment: Faces Faces Pain Scale: Hurts a little bit Pain Location: LLE Pain Descriptors / Indicators: Guarding Pain Intervention(s): Limited activity within patient's tolerance;Monitored during session;Repositioned    Home Living Family/patient expects to be discharged to:: Private residence Living Arrangements: Spouse/significant other Available Help at Discharge: Family;Available 24 hours/day Type of Home: House Home Access: Stairs to enter Entrance Stairs-Rails: Right;Left;Can reach both Entrance Stairs-Number of Steps: 8 Home Layout: Two level Home Equipment: None Additional Comments: no personal equipment    Prior Function Level of Independence: Independent         Comments: recent vacation in [place]     Hand Dominance   Dominant Hand: Right    Extremity/Trunk Assessment   Upper Extremity Assessment Upper Extremity Assessment: Overall WFL for tasks assessed    Lower Extremity Assessment Lower Extremity Assessment: Generalized weakness    Cervical /  Trunk Assessment Cervical / Trunk Assessment: Kyphotic (mild)  Communication   Communication: No difficulties  Cognition  Arousal/Alertness: Awake/alert Behavior During Therapy: WFL for tasks assessed/performed Overall Cognitive Status: Within Functional Limits for tasks assessed                                        General Comments General comments (skin integrity, edema, etc.): pt is able to maneuver to side of bed somewhat but required mod assist to finally scoot to EOB.    Exercises     Assessment/Plan    PT Assessment Patient needs continued PT services  PT Problem List Decreased strength;Decreased range of motion;Decreased activity tolerance;Decreased balance;Decreased mobility;Decreased coordination;Decreased cognition;Decreased knowledge of use of DME;Decreased skin integrity;Cardiopulmonary status limiting activity       PT Treatment Interventions DME instruction;Gait training;Stair training;Functional mobility training;Therapeutic activities;Therapeutic exercise;Balance training;Neuromuscular re-education;Patient/family education    PT Goals (Current goals can be found in the Care Plan section)  Acute Rehab PT Goals Patient Stated Goal: to get stronger and feel better PT Goal Formulation: With patient Time For Goal Achievement: 09/30/19 Potential to Achieve Goals: Good    Frequency Min 3X/week   Barriers to discharge Decreased caregiver support;Inaccessible home environment has drops in BP with gait    Co-evaluation               AM-PAC PT "6 Clicks" Mobility  Outcome Measure Help needed turning from your back to your side while in a flat bed without using bedrails?: A Little Help needed moving from lying on your back to sitting on the side of a flat bed without using bedrails?: A Lot Help needed moving to and from a bed to a chair (including a wheelchair)?: A Lot Help needed standing up from a chair using your arms (e.g., wheelchair or bedside chair)?: A Lot Help needed to walk in hospital room?: A Lot Help needed climbing 3-5 steps with a railing? : A Lot 6  Click Score: 13    End of Session Equipment Utilized During Treatment: Gait belt Activity Tolerance: Patient tolerated treatment well;Patient limited by fatigue;Treatment limited secondary to medical complications (Comment) Patient left: in bed;with call bell/phone within reach;with bed alarm set Nurse Communication: Mobility status PT Visit Diagnosis: Unsteadiness on feet (R26.81);Muscle weakness (generalized) (M62.81);Difficulty in walking, not elsewhere classified (R26.2)    Time: 0300-9233 PT Time Calculation (min) (ACUTE ONLY): 38 min   Charges:   PT Evaluation $PT Eval Moderate Complexity: 1 Mod PT Treatments $Gait Training: 8-22 mins $Therapeutic Exercise: 8-22 mins       Ivar Drape 09/16/2019, 5:11 PM  Samul Dada, PT MS Acute Rehab Dept. Number: [facility] [id number] and [facility] [phone]

## 2019-09-16 NOTE — Progress Notes (Signed)
PROGRESS NOTE    Kjerstin Abrigo  POE:[id number] DOB: 15-Jul-1950 DOA: 09/12/2019 PCP: Patient, No Pcp Per    Brief Narrative: PCCM pickup 09/15/2019 patient was admitted 09/12/2019.  69 year old female admitted with submassive PE status post catheter-based thrombolysis by interventional radiology.  On heparin drip. Work-up included echocardiogram with right ventricular failure and hypokinesis Left lower extremity extensive DVT Head CT left posterior convexity scalp hematoma without underlying skull fracture. Cervical CT no acute traumatic injury noted. Covid negative blood culture negative CT angiogram with acute PE with a large saddle embolism. Assessment & Plan:   Active Problems:   PE (pulmonary thromboembolism) (HCC)   Pulmonary embolism (HCC)   #1 acute pulmonary embolism/submassive PE status post catheter guided thrombolysis by IR.  Currently on heparin drip.  Patient had some oozing from the right groin site yesterday improved after applying topical tranexamic acid. Continue heparin to transition to DOAC once bleeding has resolved Consult pharmacy for Eliquis to start 09/17/2019  #2 AKI resolved with IV hydration follow-up labs in a.m. creatinine 0.83.  #3 acute blood loss anemia hemoglobin slowly trending down.  Hemoglobin 7.9 down from 8.9 from 13.3 at the time of admission.  Transfuse 1 unit of packed RBC today.  #4 left lower extremity extensive DVT on heparin  #5 history of depression continue Prozac  #6 hyperlipidemia on Lipitor 20 mg daily.    Estimated body mass index is 31.8 kg/m as calculated from the following:   Height as of this encounter: 5' 6.5" (1.689 m).   Weight as of this encounter: 90.7 kg.  DVT prophylaxis: Heparin Code Status: Full code Family Communication: None at bedside  disposition Plan:  Status is: Inpatient   Dispo: The patient is from: Home              Anticipated d/c is to: Home              Anticipated d/c date is: 2 days               Patient currently is not medically stable to d/c.  Patient admitted with acute submassive PE status post catheter directed thrombolysis on IV heparin.    Consultants:   PCCM and interventional radiology  Procedures: Thrombolysis 09/13/2019 Antimicrobials: None Subjective: She is awake and alert denies any new complaints no nausea vomiting abdominal pain chest pain shortness of breath. Objective: Vitals:   09/16/19 0422 09/16/19 0500 09/16/19 0600 09/16/19 0800  BP:  107/64 124/62 (!) 156/30  Pulse:  85 87 94  Resp:  18 18 17   Temp:   98.6 F (37 C) 98.7 F (37.1 C)  TempSrc:    Oral  SpO2:  90% 92% 94%  Weight: 90.7 kg     Height:        Intake/Output Summary (Last 24 hours) at 09/16/2019 1131 Last data filed at 09/16/2019 0900 Gross per 24 hour  Intake 2335.46 ml  Output 1900 ml  Net 435.46 ml   Filed Weights   09/14/19 0500 09/15/19 0500 09/16/19 0422  Weight: 99.5 kg 99.2 kg 90.7 kg    Examination:  General exam: Appears calm and comfortable  Respiratory system: Clear to auscultation. Respiratory effort normal. Cardiovascular system: S1 & S2 heard, RRR. No JVD, murmurs, rubs, gallops or clicks. No pedal edema. Gastrointestinal system: Abdomen is nondistended, soft and nontender. No organomegaly or masses felt. Normal bowel sounds heard. Central nervous system: Alert and oriented. No focal neurological deficits. Extremities: Bilateral lower extremity edema left more than  right Skin: No rashes, lesions or ulcers Psychiatry: Judgement and insight appear normal. Mood & affect appropriate.     Data Reviewed: I have personally reviewed following labs and imaging studies  CBC: Recent Labs  Lab 09/13/19 1850 09/13/19 1850 09/14/19 0102 09/14/19 0306 09/14/19 1649 09/15/19 0147 09/16/19 0241  WBC 9.4  --   --  8.6 7.9 8.0 7.0  HGB 11.7*   < > 10.8* 10.2* 9.7* 8.9* 7.9*  HCT 36.8   < > 33.7* 32.3* 30.6* 28.5* 25.5*  MCV 92.5  --   --  92.6 92.4 92.8 93.8    PLT 107*  --   --  102* 119* 131* 158   < > = values in this interval not displayed.   Basic Metabolic Panel: Recent Labs  Lab 09/12/19 1256 09/13/19 0517 09/15/19 1325 09/16/19 0241  NA 141 138 140 143  K 4.4 5.1 3.9 3.8  CL 108 110 107 110  CO2 22 22 23 25   GLUCOSE 147* 122* 117* 110*  BUN 15 13 12 11   CREATININE 1.34* 0.86 0.93 0.83  CALCIUM 8.1* 7.1* 7.5* 7.6*  MG  --  2.0  --   --   PHOS  --  2.8  --   --    GFR: Estimated Creatinine Clearance: 75.4 mL/min (by C-G formula based on SCr of 0.83 mg/dL). Liver Function Tests: No results for input(s): AST, ALT, ALKPHOS, BILITOT, PROT, ALBUMIN in the last 168 hours. No results for input(s): LIPASE, AMYLASE in the last 168 hours. No results for input(s): AMMONIA in the last 168 hours. Coagulation Profile: Recent Labs  Lab 09/12/19 1755  INR 1.1   Cardiac Enzymes: No results for input(s): CKTOTAL, CKMB, CKMBINDEX, TROPONINI in the last 168 hours. BNP (last 3 results) No results for input(s): PROBNP in the last 8760 hours. HbA1C: No results for input(s): HGBA1C in the last 72 hours. CBG: Recent Labs  Lab 09/12/19 2207  GLUCAP 97   Lipid Profile: No results for input(s): CHOL, HDL, LDLCALC, TRIG, CHOLHDL, LDLDIRECT in the last 72 hours. Thyroid Function Tests: No results for input(s): TSH, T4TOTAL, FREET4, T3FREE, THYROIDAB in the last 72 hours. Anemia Panel: No results for input(s): VITAMINB12, FOLATE, FERRITIN, TIBC, IRON, RETICCTPCT in the last 72 hours. Sepsis Labs: Recent Labs  Lab 09/12/19 1256 09/12/19 1400 09/12/19 2301  PROCALCITON <0.10  --   --   LATICACIDVEN  --  1.8 1.7    Recent Results (from the past 240 hour(s))  Blood Culture (routine x 2)     Status: None (Preliminary result)   Collection Time: 09/12/19  1:40 PM   Specimen: BLOOD  Result Value Ref Range Status   Specimen Description BLOOD SITE NOT SPECIFIED  Final   Special Requests   Final    BOTTLES DRAWN AEROBIC ONLY Blood Culture  adequate volume   Culture   Final    NO GROWTH 4 DAYS Performed at [facility], [address], [place], [address] [place]    Report Status PENDING  Incomplete  SARS Coronavirus 2 by RT PCR (hospital order, performed in [facility] hospital lab) Nasopharyngeal Nasopharyngeal Swab     Status: None   Collection Time: 09/12/19  2:00 PM   Specimen: Nasopharyngeal Swab  Result Value Ref Range Status   SARS Coronavirus 2 NEGATIVE NEGATIVE Final    Comment: (NOTE) SARS-CoV-2 target nucleic acids are NOT DETECTED.  The SARS-CoV-2 RNA is generally detectable in upper and lower respiratory specimens during the  acute phase of infection. The lowest concentration of SARS-CoV-2 viral copies this assay can detect is 250 copies / mL. A negative result does not preclude SARS-CoV-2 infection and should not be used as the sole basis for treatment or other patient management decisions.  A negative result may occur with improper specimen collection / handling, submission of specimen other than nasopharyngeal swab, presence of viral mutation(s) within the areas targeted by this assay, and inadequate number of viral copies (<250 copies / mL). A negative result must be combined with clinical observations, patient history, and epidemiological information.  Fact Sheet for Patients:   BoilerBrush.com.cy  Fact Sheet for Healthcare Providers: https://pope.com/  This test is not yet approved or  cleared by the [place] FDA and has been authorized for detection and/or diagnosis of SARS-CoV-2 by FDA under an Emergency Use Authorization (EUA).  This EUA will remain in effect (meaning this test can be used) for the duration of the COVID-19 declaration under Section 564(b)(1) of the Act, 21 U.S.C. section 360bbb-3(b)(1), unless the authorization is terminated or revoked sooner.  Performed at [facility], [address] [place],  [place] [address]   MRSA PCR Screening     Status: None   Collection Time: 09/12/19 10:00 PM   Specimen: Nasal Mucosa; Nasopharyngeal  Result Value Ref Range Status   MRSA by PCR NEGATIVE NEGATIVE Final    Comment:        The GeneXpert MRSA Assay (FDA approved for NASAL specimens only), is one component of a comprehensive MRSA colonization surveillance program. It is not intended to diagnose MRSA infection nor to guide or monitor treatment for MRSA infections. Performed at [facility], [address] [place], [place] [address]   Blood Culture (routine x 2)     Status: None (Preliminary result)   Collection Time: 09/13/19 12:37 AM   Specimen: BLOOD LEFT ARM  Result Value Ref Range Status   Specimen Description BLOOD LEFT ARM  Final   Special Requests   Final    BOTTLES DRAWN AEROBIC AND ANAEROBIC Blood Culture results may not be optimal due to an excessive volume of blood received in culture bottles   Culture   Final    NO GROWTH 3 DAYS Performed at [facility], [address] [place], [place] [address]    Report Status PENDING  Incomplete         Radiology Studies: No results found.      Scheduled Meds: . atorvastatin  20 mg Oral Daily  . Chlorhexidine Gluconate Cloth  6 each Topical Daily  . FLUoxetine  60 mg Oral Daily  . gabapentin  300 mg Oral BID  . mouth rinse  15 mL Mouth Rinse BID  . polyethylene glycol  17 g Oral Daily  . primidone  250 mg Oral BID  . sodium chloride flush  3 mL Intravenous Q12H  . sodium chloride flush  3 mL Intravenous Q12H  . Thrombi-Pad  1 each Topical Once   Continuous Infusions: . sodium chloride    . sodium chloride    . sodium chloride Stopped (09/15/19 0515)  . sodium chloride Stopped (09/13/19 1013)  . heparin 1,150 Units/hr (09/15/19 1100)  . lactated ringers 100 mL/hr at 09/16/19 0155     LOS: 4 days     Alwyn Ren, MD 09/16/2019, 11:31 AM

## 2019-09-16 NOTE — Progress Notes (Addendum)
ANTICOAGULATION CONSULT NOTE - Follow Up Consult  Pharmacy Consult for Heparin Indication: saddle pulmonary embolus  Allergies  Allergen Reactions  . Codeine Other (See Comments)    numbness    Patient Measurements: Height: 5' 6.5" (168.9 cm) Weight: 90.7 kg (200 lb) IBW/kg (Calculated) : 60.45 Heparin Dosing Weight: 83.4kg  Vital Signs: Temp: 98.8 F (37.1 C) (08/28 1200) Temp Source: Oral (08/28 1200) BP: 130/70 (08/28 1200) Pulse Rate: 95 (08/28 1200)  Labs: Recent Labs    09/14/19 1649 09/14/19 1649 09/15/19 0147 09/15/19 0752 09/15/19 1325 09/15/19 1643 09/16/19 0241  HGB 9.7*   < > 8.9*  --   --   --  7.9*  HCT 30.6*  --  28.5*  --   --   --  25.5*  PLT 119*  --  131*  --   --   --  158  HEPARINUNFRC 0.36   < >  --  0.29*  --  0.52 0.62  CREATININE  --   --   --   --  0.93  --  0.83   < > = values in this interval not displayed.    Estimated Creatinine Clearance: 75.4 mL/min (by C-G formula based on SCr of 0.83 mg/dL).   Assessment: 68yofemaleon heparinwith initial dosing for saddle PE, underwent EKOS 8/24 (+alteplase 12mg  x2). Level is therapeutic today at 0.62 with heparin running at 1150 units/hour. Due to heparin level close to the upper limit of her goal, will reduce rate slightly.  Previously had moderate bleeding after removal of an IV line (8/26). Bleeding from the femoral sheath site has been controlled and resolved for now. CBC downtrending, no signs of bleeding noted.  Per provider, will plan to switch to rivoroxaban tomorrow (8/29). Apixaban is non formulary for her insurance.   Pt h/h is downtrended to Hgb 7.9 HCT 58.5 with PLTC of 158.   Goal of Therapy:  Heparin level 0.3-0.7 units/ml Monitor platelets by anticoagulation protocol: Yes   Plan:  Reduce heparin to 1100 U/hr Daily HL Monitor for signs/symptoms/amount of bleeding, CBC   05-22-1985  PGY1 Pharmacy Resident 09/16/2019,12:19 PM

## 2019-09-17 LAB — BPAM RBC
Blood Product Expiration Date: 202109262359
ISSUE DATE / TIME: 202108281647
Unit Type and Rh: 5100

## 2019-09-17 LAB — BASIC METABOLIC PANEL
Anion gap: 8 (ref 5–15)
BUN: 7 mg/dL — ABNORMAL LOW (ref 8–23)
CO2: 26 mmol/L (ref 22–32)
Calcium: 8 mg/dL — ABNORMAL LOW (ref 8.9–10.3)
Chloride: 106 mmol/L (ref 98–111)
Creatinine, Ser: 0.77 mg/dL (ref 0.44–1.00)
GFR calc Af Amer: >60 mL/min (ref >60)
GFR calc non Af Amer: >60 mL/min (ref >60)
Glucose, Bld: 111 mg/dL — ABNORMAL HIGH (ref 70–99)
Potassium: 3.7 mmol/L (ref 3.5–5.1)
Sodium: 140 mmol/L (ref 135–145)

## 2019-09-17 LAB — CBC
HCT: 28.4 % — ABNORMAL LOW (ref 36.0–46.0)
Hemoglobin: 8.9 g/dL — ABNORMAL LOW (ref 12.0–15.0)
MCH: 29.4 pg (ref 26.0–34.0)
MCHC: 31.3 g/dL (ref 30.0–36.0)
MCV: 93.7 fL (ref 80.0–100.0)
Platelets: 185 10*3/uL (ref 150–400)
RBC: 3.03 MIL/uL — ABNORMAL LOW (ref 3.87–5.11)
RDW: 16.1 % — ABNORMAL HIGH (ref 11.5–15.5)
WBC: 6.8 10*3/uL (ref 4.0–10.5)
nRBC: 0.3 % — ABNORMAL HIGH (ref 0.0–0.2)

## 2019-09-17 LAB — TYPE AND SCREEN
ABO/RH(D): O POS
Antibody Screen: NEGATIVE
Unit division: 0

## 2019-09-17 LAB — HEPARIN LEVEL (UNFRACTIONATED): Heparin Unfractionated: 0.86 IU/mL — ABNORMAL HIGH (ref 0.30–0.70)

## 2019-09-17 LAB — CULTURE, BLOOD (ROUTINE X 2)
Culture: NO GROWTH
Special Requests: ADEQUATE

## 2019-09-17 MED ORDER — RIVAROXABAN 15 MG PO TABS
15.0000 mg | ORAL_TABLET | Freq: Two times a day (BID) | ORAL | Status: DC
  Administered 2019-09-17 – 2019-09-19 (×6): 15 mg via ORAL
  Filled 2019-09-17 (×6): qty 1

## 2019-09-17 MED ORDER — RIVAROXABAN 20 MG PO TABS: 20.0000 mg | ORAL_TABLET | Freq: Every day | ORAL | Status: DC

## 2019-09-17 NOTE — Progress Notes (Signed)
Very minimal amount of bleeding from right groin site last night, RN placed dressing on site.

## 2019-09-17 NOTE — Progress Notes (Signed)
Inpatient Rehab Admissions Coordinator Note:   Per PT recommendation, pt was screened for CIR candidacy by Wolfgang Phoenix, MS, CCC-SLP.  At this time we are not recommending an Inpatient Rehab consult.   Will continue to monitor pt's progress with therapies from afar.   Please contact me with questions.    Wolfgang Phoenix, MS, CCC-SLP Admissions Coordinator [phone] 09/17/19 9:33 AM

## 2019-09-17 NOTE — Progress Notes (Signed)
PROGRESS NOTE    Julia Gomez  ZCH:[id number] DOB: [birth year] DOA: 09/12/2019 PCP: Patient, No Pcp Per    Brief Narrative: PCCM pickup 09/15/2019 patient was admitted 09/12/2019.  69 year old female admitted with submassive PE status post catheter-based thrombolysis by interventional radiology.  On heparin drip. Work-up included echocardiogram with right ventricular failure and hypokinesis Left lower extremity extensive DVT Head CT left posterior convexity scalp hematoma without underlying skull fracture. Cervical CT no acute traumatic injury noted. Covid negative blood culture negative CT angiogram with acute PE with a large saddle embolism. Assessment & Plan:   Active Problems:   PE (pulmonary thromboembolism) (HCC)   Pulmonary embolism (HCC)   #1 acute pulmonary embolism/submassive PE status post catheter guided thrombolysis by IR.  Currently on heparin drip.  To start Xarelto today.  Mild oozing from the right groin site improved and resolved.  #2 AKI resolved with IV hydration follow-up labs in a.m. creatinine 0.83.  #3 acute blood loss anemia hemoglobin slowly trending down.  Patient received 1 units of packed RBCs on 09/16/2019 for hemoglobin of 7.9.  Her hemoglobin 7.9 down from 8.9 from 13.3 at the time of admission. .  #4 left lower extremity extensive DVT on heparin  #5 history of depression continue Prozac  #6 hyperlipidemia on Lipitor 20 mg daily.    Estimated body mass index is 30.68 kg/m as calculated from the following:   Height as of this encounter: 5' 6.5" (1.689 m).   Weight as of this encounter: 87.5 kg.  DVT prophylaxis: Heparin Code Status: Full code Family Communication: None at bedside  disposition Plan:  Status is: Inpatient   Dispo: The patient is from: Home              Anticipated d/c is to: Home              Anticipated d/c date is 1 day              Patient currently is not medically stable to d/c.  Patient admitted with acute submassive PE  status post catheter directed thrombolysis on IV heparin.  Patient was seen by physical therapy recommended CIR. however CIR has not accepted the patient yet.  I am expecting her to be medically stable for discharge in 1 day    Consultants:   PCCM and interventional radiology  Procedures: Thrombolysis 09/13/2019 Antimicrobials: None Subjective: She is awake and alert denies any new complaints no nausea vomiting abdominal pain chest pain shortness of breath. Objective: Vitals:   09/16/19 1915 09/16/19 1918 09/16/19 1931 09/17/19 0350  BP:  129/71 138/68 138/74  Pulse: 98 100 95 82  Resp: 19 20 (!) 21 18  Temp:  99.1 F (37.3 C) 98.8 F (37.1 C) 98.5 F (36.9 C)  TempSrc:  Oral    SpO2: 95% 97% 94% 90%  Weight:    87.5 kg  Height:        Intake/Output Summary (Last 24 hours) at 09/17/2019 1111 Last data filed at 09/17/2019 1000 Gross per 24 hour  Intake 2825.4 ml  Output 3500 ml  Net -674.6 ml   Filed Weights   09/15/19 0500 09/16/19 0422 09/17/19 0350  Weight: 99.2 kg 90.7 kg 87.5 kg    Examination:  General exam: Appears calm and comfortable  Respiratory system: Clear to auscultation. Respiratory effort normal. Cardiovascular system: S1 & S2 heard, RRR. No JVD, murmurs, rubs, gallops or clicks. No pedal edema. Gastrointestinal system: Abdomen is nondistended, soft and nontender. No organomegaly or  masses felt. Normal bowel sounds heard. Central nervous system: Alert and oriented. No focal neurological deficits. Extremities: Bilateral lower extremity edema left more than right Skin: No rashes, lesions or ulcers Psychiatry: Judgement and insight appear normal. Mood & affect appropriate.     Data Reviewed: I have personally reviewed following labs and imaging studies  CBC: Recent Labs  Lab 09/14/19 0306 09/14/19 0306 09/14/19 1649 09/15/19 0147 09/16/19 0241 09/16/19 2149 09/17/19 0456  WBC 8.6  --  7.9 8.0 7.0  --  6.8  HGB 10.2*   < > 9.7* 8.9* 7.9* 9.1*  8.9*  HCT 32.3*   < > 30.6* 28.5* 25.5* 28.2* 28.4*  MCV 92.6  --  92.4 92.8 93.8  --  93.7  PLT 102*  --  119* 131* 158  --  185   < > = values in this interval not displayed.   Basic Metabolic Panel: Recent Labs  Lab 09/12/19 1256 09/13/19 0517 09/15/19 1325 09/16/19 0241 09/17/19 0456  NA 141 138 140 143 140  K 4.4 5.1 3.9 3.8 3.7  CL 108 110 107 110 106  CO2 22 22 23 25 26   GLUCOSE 147* 122* 117* 110* 111*  BUN 15 13 12 11  7*  CREATININE 1.34* 0.86 0.93 0.83 0.77  CALCIUM 8.1* 7.1* 7.5* 7.6* 8.0*  MG  --  2.0  --   --   --   PHOS  --  2.8  --   --   --    GFR: Estimated Creatinine Clearance: 76.8 mL/min (by C-G formula based on SCr of 0.77 mg/dL). Liver Function Tests: No results for input(s): AST, ALT, ALKPHOS, BILITOT, PROT, ALBUMIN in the last 168 hours. No results for input(s): LIPASE, AMYLASE in the last 168 hours. No results for input(s): AMMONIA in the last 168 hours. Coagulation Profile: Recent Labs  Lab 09/12/19 1755  INR 1.1   Cardiac Enzymes: No results for input(s): CKTOTAL, CKMB, CKMBINDEX, TROPONINI in the last 168 hours. BNP (last 3 results) No results for input(s): PROBNP in the last 8760 hours. HbA1C: No results for input(s): HGBA1C in the last 72 hours. CBG: Recent Labs  Lab 09/12/19 2207  GLUCAP 97   Lipid Profile: No results for input(s): CHOL, HDL, LDLCALC, TRIG, CHOLHDL, LDLDIRECT in the last 72 hours. Thyroid Function Tests: No results for input(s): TSH, T4TOTAL, FREET4, T3FREE, THYROIDAB in the last 72 hours. Anemia Panel: No results for input(s): VITAMINB12, FOLATE, FERRITIN, TIBC, IRON, RETICCTPCT in the last 72 hours. Sepsis Labs: Recent Labs  Lab 09/12/19 1256 09/12/19 1400 09/12/19 2301  PROCALCITON <0.10  --   --   LATICACIDVEN  --  1.8 1.7    Recent Results (from the past 240 hour(s))  Blood Culture (routine x 2)     Status: None (Preliminary result)   Collection Time: 09/12/19  1:40 PM   Specimen: BLOOD  Result  Value Ref Range Status   Specimen Description BLOOD SITE NOT SPECIFIED  Final   Special Requests   Final    BOTTLES DRAWN AEROBIC ONLY Blood Culture adequate volume   Culture   Final    NO GROWTH 4 DAYS Performed at [facility], [address] [place], [place] [address]    Report Status PENDING  Incomplete  SARS Coronavirus 2 by RT PCR (hospital order, performed in [facility] hospital lab) Nasopharyngeal Nasopharyngeal Swab     Status: None   Collection Time: 09/12/19  2:00 PM   Specimen: Nasopharyngeal Swab  Result  Value Ref Range Status   SARS Coronavirus 2 NEGATIVE NEGATIVE Final    Comment: (NOTE) SARS-CoV-2 target nucleic acids are NOT DETECTED.  The SARS-CoV-2 RNA is generally detectable in upper and lower respiratory specimens during the acute phase of infection. The lowest concentration of SARS-CoV-2 viral copies this assay can detect is 250 copies / mL. A negative result does not preclude SARS-CoV-2 infection and should not be used as the sole basis for treatment or other patient management decisions.  A negative result may occur with improper specimen collection / handling, submission of specimen other than nasopharyngeal swab, presence of viral mutation(s) within the areas targeted by this assay, and inadequate number of viral copies (<250 copies / mL). A negative result must be combined with clinical observations, patient history, and epidemiological information.  Fact Sheet for Patients:   BoilerBrush.com.cy  Fact Sheet for Healthcare Providers: https://pope.com/  This test is not yet approved or  cleared by the [place] FDA and has been authorized for detection and/or diagnosis of SARS-CoV-2 by FDA under an Emergency Use Authorization (EUA).  This EUA will remain in effect (meaning this test can be used) for the duration of the COVID-19 declaration under Section 564(b)(1) of the Act, 21 U.S.C. section  360bbb-3(b)(1), unless the authorization is terminated or revoked sooner.  Performed at [facility], [address], [place], [place] [address]   MRSA PCR Screening     Status: None   Collection Time: 09/12/19 10:00 PM   Specimen: Nasal Mucosa; Nasopharyngeal  Result Value Ref Range Status   MRSA by PCR NEGATIVE NEGATIVE Final    Comment:        The GeneXpert MRSA Assay (FDA approved for NASAL specimens only), is one component of a comprehensive MRSA colonization surveillance program. It is not intended to diagnose MRSA infection nor to guide or monitor treatment for MRSA infections. Performed at [facility], [address], [place], [place] [address]   Blood Culture (routine x 2)     Status: None (Preliminary result)   Collection Time: 09/13/19 12:37 AM   Specimen: BLOOD LEFT ARM  Result Value Ref Range Status   Specimen Description BLOOD LEFT ARM  Final   Special Requests   Final    BOTTLES DRAWN AEROBIC AND ANAEROBIC Blood Culture results may not be optimal due to an excessive volume of blood received in culture bottles   Culture   Final    NO GROWTH 3 DAYS Performed at [facility], [address], [place], [place] [address]    Report Status PENDING  Incomplete         Radiology Studies: No results found.      Scheduled Meds: . sodium chloride   Intravenous Once  . atorvastatin  20 mg Oral Daily  . Chlorhexidine Gluconate Cloth  6 each Topical Daily  . FLUoxetine  60 mg Oral Daily  . gabapentin  300 mg Oral BID  . mouth rinse  15 mL Mouth Rinse BID  . polyethylene glycol  17 g Oral Daily  . primidone  250 mg Oral BID  . Rivaroxaban  15 mg Oral BID WC   Followed by  . [START ON 10/02/2019] rivaroxaban  20 mg Oral Q supper  . sodium chloride flush  3 mL Intravenous Q12H  . sodium chloride flush  3 mL Intravenous Q12H  . Thrombi-Pad  1 each Topical Once   Continuous Infusions: . sodium chloride    . sodium  chloride    . sodium  chloride Stopped (09/15/19 0515)  . sodium chloride Stopped (09/13/19 1013)  . lactated ringers 100 mL/hr at 09/17/19 3825     LOS: 5 days     Alwyn Ren, MD 09/17/2019, 11:11 AM

## 2019-09-17 NOTE — Progress Notes (Addendum)
ANTICOAGULATION CONSULT NOTE  Pharmacy Consult for Heparin and Rivaroxaban  Indication: saddle pulmonary embolus  +LLE DVT  Allergies  Allergen Reactions  . Codeine Other (See Comments)    numbness    Patient Measurements: Height: 5' 6.5" (168.9 cm) Weight: 87.5 kg (193 lb) IBW/kg (Calculated) : 60.45 Heparin Dosing Weight: 83.4kg  Vital Signs: Temp: 98.5 F (36.9 C) (08/29 0350) Temp Source: Oral (08/28 1918) BP: 138/74 (08/29 0350) Pulse Rate: 82 (08/29 0350)  Labs: Recent Labs    09/15/19 0147 09/15/19 0147 09/15/19 0752 09/15/19 1325 09/15/19 1643 09/16/19 0241 09/16/19 0241 09/16/19 2149 09/17/19 0456  HGB 8.9*   < >  --   --   --  7.9*   < > 9.1* 8.9*  HCT 28.5*   < >  --   --   --  25.5*  --  28.2* 28.4*  PLT 131*  --   --   --   --  158  --   --  185  HEPARINUNFRC  --   --    < >  --  0.52 0.62  --   --  0.86*  CREATININE  --   --   --  0.93  --  0.83  --   --  0.77   < > = values in this interval not displayed.    Estimated Creatinine Clearance: 76.8 mL/min (by C-G formula based on SCr of 0.77 mg/dL).   History: 68yofemaleon heparinwith initial dosing for saddle PE and LLE DVT 09/14/19, underwent EKOS 8/24 (+alteplase 12mg  x2). Previously had moderate bleeding after removal of an IV line (8/26). Bleeding from the femoral sheath site has been controlled.  Assessment: Level is supratherapeutic today at 0.86 with heparin running at 1100 units/hour. Heparin has been therapeutic for 6 days. CBC stable. Small amount of bleeding from IV removal site overnight per RN, now controlled. Pharmacy consulted for rivaroxaban initiation. Patients insurance will not cover apixaban.   Goal of Therapy:  Monitor platelets by anticoagulation protocol: Yes   Plan:  Discontinue heparin Initiate rivaroxaban 15 mg twice daily x15 days followed by 20 mg once daily  Monitor for signs/symptoms/amount of bleeding, CBC  07-10-1992  PGY1 Pharmacy Resident 09/17/2019,7:16  AM    I discussed / reviewed the pharmacy note by Dr. 09/19/2019 and I agree with the resident's findings and plans as documented.

## 2019-09-17 NOTE — Discharge Instructions (Signed)
Rivaroxaban (Xarelto) Discharge Instructions   Patient has received an education kit containing ([facility] Trial Offer Card, DVT/PE brochure, Dosing Diary, and Xarelto Medication Guide).    Call 911 or return immediately to the nearest ED if you develop bleeding (e.g. nose, gums, vomit, urine, bloody or dark stools), unusual bruising, head trauma (even if minor), severe headache, altered mental status, change in speech, weakness on one side of body, shortness of breath, swollen lips/tongue/face/neck, chest pain, or other concerns.    Information on my medicine - XARELTO (rivaroxaban)  This medication education was provided to me or my healthcare representative as part of my discharge instructions.   WHY WAS XARELTO PRESCRIBED FOR YOU?  Xarelto was prescribed to treat blood clots that may have been found in the veins of your legs (deep vein thrombosis) or in your lungs (pulmonary embolism) and to reduce the risk of them occurring again.   WHAT DO YOU NEED TO KNOW ABOUT XARELTO?  The starting dose is one 15 mg tablet taken TWICE daily with food then on  (10/02/2019)  the dose is changed to one 20 mg tablet taken ONCE A DAY with your evening meal.   DO NOT stop taking Xarelto without talking to the health care provider who prescribed the medication. Refill your prescription for 20 mg tablets before you run out.  After discharge, you should have regular check-up appointments with your healthcare provider that is prescribing your Xarelto. In the future your dose may need to be changed if your kidney function changes by a significant amount.   WHAT DO YOU DO IF YOU MISS A DOSE?  If you are taking Xarelto TWICE DAILY and you miss a dose, take it as soon as you remember. You may take two 15 mg tablets (total 30 mg) at the same time then resume your regularly scheduled 15 mg twice daily the next day.   If you are taking Xarelto ONCE DAILY and you miss a dose, take it as soon as you remember on the  same day then continue your regularly scheduled once daily regimen the next day. Do not take two doses of Xarelto at the same time.   IMPORTANT SAFETY INFORMATION  Xarelto is a blood thinner medicine that can cause bleeding. You should call your healthcare provider right away if you experience any of the following:  -  Bleeding from an injury or your nose that does not stop.  -  Unusual colored urine (red or dark brown) or unusual colored stools (red or black).  -  Unusual bruising for unknown reasons.  -  A serious fall or if you hit your head (even if there is no bleeding).   Some medicines may interact with Xarelto and might increase your risk of bleeding while on Xarelto. To help avoid this, consult your healthcare provider or pharmacist prior to using any new prescription or non-prescription medications, including herbals, vitamins, non-steroidal anti-inflammatory drugs (NSAIDs) and supplements.   This website has more information on Xarelto: [email or web address]

## 2019-09-18 ENCOUNTER — Inpatient Hospital Stay (HOSPITAL_COMMUNITY): Payer: Medicare HMO

## 2019-09-18 DIAGNOSIS — R52 Pain, unspecified: Secondary | ICD-10-CM

## 2019-09-18 LAB — BASIC METABOLIC PANEL
Anion gap: 8 (ref 5–15)
BUN: 8 mg/dL (ref 8–23)
CO2: 26 mmol/L (ref 22–32)
Calcium: 8.2 mg/dL — ABNORMAL LOW (ref 8.9–10.3)
Chloride: 105 mmol/L (ref 98–111)
Creatinine, Ser: 0.87 mg/dL (ref 0.44–1.00)
GFR calc Af Amer: >60 mL/min (ref >60)
GFR calc non Af Amer: >60 mL/min (ref >60)
Glucose, Bld: 119 mg/dL — ABNORMAL HIGH (ref 70–99)
Potassium: 4.1 mmol/L (ref 3.5–5.1)
Sodium: 139 mmol/L (ref 135–145)

## 2019-09-18 LAB — CULTURE, BLOOD (ROUTINE X 2): Culture: NO GROWTH

## 2019-09-18 LAB — CBC
HCT: 30.2 % — ABNORMAL LOW (ref 36.0–46.0)
Hemoglobin: 9.6 g/dL — ABNORMAL LOW (ref 12.0–15.0)
MCH: 30 pg (ref 26.0–34.0)
MCHC: 31.8 g/dL (ref 30.0–36.0)
MCV: 94.4 fL (ref 80.0–100.0)
Platelets: 225 10*3/uL (ref 150–400)
RBC: 3.2 MIL/uL — ABNORMAL LOW (ref 3.87–5.11)
RDW: 16.1 % — ABNORMAL HIGH (ref 11.5–15.5)
WBC: 7.5 10*3/uL (ref 4.0–10.5)
nRBC: 0.3 % — ABNORMAL HIGH (ref 0.0–0.2)

## 2019-09-18 MED ORDER — IBUPROFEN 200 MG PO TABS
400.0000 mg | ORAL_TABLET | Freq: Three times a day (TID) | ORAL | Status: DC
  Administered 2019-09-18 – 2019-09-19 (×5): 400 mg via ORAL
  Filled 2019-09-18 (×5): qty 2

## 2019-09-18 MED ORDER — TRAMADOL HCL 50 MG PO TABS
50.0000 mg | ORAL_TABLET | Freq: Four times a day (QID) | ORAL | Status: DC | PRN
Start: 2019-09-18 — End: 2019-09-18

## 2019-09-18 NOTE — [place] FL2 (Signed)
[place] MEDICAID FL2 LEVEL OF CARE SCREENING TOOL     IDENTIFICATION  Patient Name: Julia Gomez Birthdate: November 12, 1950 Sex: female Admission Date (Current Location): 09/12/2019  [facility] and [place] Number:  Producer, television/film/video and Address:  [facility], [address] [place], [place] [address]      Provider Number: [id number]  Attending Physician Name and Address:  Alwyn Ren, MD  Relative Name and Phone Number:  Su Hilt, spouse, [phone]    Current Level of Care: Hospital Recommended Level of Care: Skilled Nursing Facility Prior Approval Number:    Date Approved/Denied:   PASRR Number: pending  Discharge Plan: SNF    Current Diagnoses: Patient Active Problem List   Diagnosis Date Noted  . Pain   . Pulmonary embolism (HCC) 09/13/2019  . PE (pulmonary thromboembolism) (HCC) 09/12/2019    Orientation RESPIRATION BLADDER Height & Weight     Self, Time, Situation, Place  Normal Incontinent, External catheter Weight: 196 lb (88.9 kg) Height:  5' 6.5" (168.9 cm)  BEHAVIORAL SYMPTOMS/MOOD NEUROLOGICAL BOWEL NUTRITION STATUS      Continent Diet (Please see DC Summary)  AMBULATORY STATUS COMMUNICATION OF NEEDS Skin   Limited Assist Verbally Normal                       Personal Care Assistance Level of Assistance  Bathing, Feeding, Dressing Bathing Assistance: Limited assistance Feeding assistance: Independent Dressing Assistance: Limited assistance     Functional Limitations Info             SPECIAL CARE FACTORS FREQUENCY  PT (By licensed PT), OT (By licensed OT)     PT Frequency: 5x/week OT Frequency: 5x/week            Contractures Contractures Info: Not present    Additional Factors Info  Code Status, Allergies Code Status Info: Full Allergies Info: Codeine           Current Medications (09/18/2019):  This is the current hospital active medication list Current Facility-Administered  Medications  Medication Dose Route Frequency Provider Last Rate Last Admin  . 0.9 %  sodium chloride infusion (Manually program via Guardrails IV Fluids)   Intravenous Once Alwyn Ren, MD      . 0.9 %  sodium chloride infusion  250 mL Intravenous PRN Raymon Mutton F, NP      . 0.9 %  sodium chloride infusion  250 mL Intravenous PRN Simonne Come, MD      . 0.9 %  sodium chloride infusion   Intravenous Continuous Simonne Come, MD   Stopped at 09/15/19 0515  . 0.9 %  sodium chloride infusion   Intravenous Continuous Simonne Come, MD   Paused at 09/13/19 1013  . acetaminophen (TYLENOL) tablet 650 mg  650 mg Oral Q6H PRN Duayne Cal, NP   650 mg at 09/18/19 0844  . atorvastatin (LIPITOR) tablet 20 mg  20 mg Oral Daily Cheri Fowler, MD   20 mg at 09/18/19 0845  . Chlorhexidine Gluconate Cloth 2 % PADS 6 each  6 each Topical Daily Cheri Fowler, MD   6 each at 09/17/19 0930  . cholestyramine (QUESTRAN) packet 4 g  4 g Oral Q6H PRN Karl Ito, MD      . docusate sodium (COLACE) capsule 100 mg  100 mg Oral BID PRN Raymon Mutton F, NP      . FLUoxetine (PROZAC) capsule 60 mg  60 mg Oral Daily Chand,  Sudham, MD   60 mg at 09/18/19 0845  . gabapentin (NEURONTIN) capsule 300 mg  300 mg Oral BID Cheri Fowler, MD   300 mg at 09/18/19 0845  . ibuprofen (ADVIL) tablet 400 mg  400 mg Oral TID Alwyn Ren, MD   400 mg at 09/18/19 1049  . lactated ringers infusion   Intravenous Continuous Cheri Fowler, MD 100 mL/hr at 09/17/19 1233 100 mL/hr at 09/17/19 1233  . MEDLINE mouth rinse  15 mL Mouth Rinse BID Agarwala, Ravi, MD   15 mL at 09/17/19 0930  . ondansetron (ZOFRAN) injection 4 mg  4 mg Intravenous Q6H PRN Raymon Mutton F, NP      . polyethylene glycol (MIRALAX / GLYCOLAX) packet 17 g  17 g Oral Daily Raymon Mutton F, NP   17 g at 09/18/19 0844  . primidone (MYSOLINE) tablet 250 mg  250 mg Oral BID Cheri Fowler, MD   250 mg at 09/18/19 0844  . Rivaroxaban (XARELTO) tablet 15 mg   15 mg Oral BID WC Kyung Rudd, RPH   15 mg at 09/18/19 8119   Followed by  . [START ON 10/02/2019] rivaroxaban (XARELTO) tablet 20 mg  20 mg Oral Q supper Kyung Rudd, [facility]      . sodium chloride flush (NS) 0.9 % injection 3 mL  3 mL Intravenous Q12H Raymon Mutton F, NP   3 mL at 09/17/19 0931  . sodium chloride flush (NS) 0.9 % injection 3 mL  3 mL Intravenous PRN Raymon Mutton F, NP      . sodium chloride flush (NS) 0.9 % injection 3 mL  3 mL Intravenous Q12H Simonne Come, MD   3 mL at 09/17/19 0931  . sodium chloride flush (NS) 0.9 % injection 3 mL  3 mL Intravenous PRN Simonne Come, MD      . [place] Pellant 3"X3" pad 1 each  1 each Topical Once Dolan Amen, MD      . Thrombi-Pad 3"X3" pad 1 each  1 each Topical PRN Cheri Fowler, MD      . tranexamic acid (CYKLOKAPRON) 500 mg in sodium chloride 0.9 % 50 mL Topical Application  500 mg Topical Once PRN Cheri Fowler, MD         Discharge Medications: Please see discharge summary for a list of discharge medications.  Relevant Imaging Results:  Relevant Lab Results:   Additional Information SSN: [id number]. Has received Pfizer vaccines (03/03/2019 , 02/10/2019)  Mearl Latin, LCSW

## 2019-09-18 NOTE — Progress Notes (Signed)
PROGRESS NOTE    Myangel Summons  SLH:[id number] DOB: Mar 16, 1950 DOA: 09/12/2019 PCP: Patient, No Pcp Per    Brief Narrative: PCCM pickup 09/15/2019 patient was admitted 09/12/2019.  69 year old female admitted with submassive PE status post catheter-based thrombolysis by interventional radiology.  On heparin drip. Work-up included echocardiogram with right ventricular failure and hypokinesis Left lower extremity extensive DVT Head CT left posterior convexity scalp hematoma without underlying skull fracture. Cervical CT no acute traumatic injury noted. Covid negative blood culture negative CT angiogram with acute PE with a large saddle embolism. Assessment & Plan:   Active Problems:   PE (pulmonary thromboembolism) (HCC)   Pulmonary embolism (HCC)   #1 acute pulmonary embolism/submassive PE status post catheter guided thrombolysis by IR.  Patient was initially treated with heparin and then Xarelto was started 09/17/2019.  No new bleeding or drainage noted from the catheter site.  #2 AKI resolved with IV hydration follow-up labs in a.m. creatinine 0.83.  #3 acute blood loss anemia-she received 1 unit of packed RBC during this hospital stay.  Her hemoglobin on admission was 13.3 which trended down to 7.9.  Today her hemoglobin is 9.6. Marland Kitchen  #4 left lower extremity extensive DVT on heparin  #5 history of depression continue Prozac  #6 hyperlipidemia on Lipitor 20 mg daily.  #7 right shoulder pain question frozen shoulder/adhesive capsulitis plain x-ray shows no evidence of acute injury patient is unable to extend or make any movements of the right shoulder with increased pain with movement.  Check MRI of the right shoulder.  PT consulted.    Estimated body mass index is 31.16 kg/m as calculated from the following:   Height as of this encounter: 5' 6.5" (1.689 m).   Weight as of this encounter: 88.9 kg.  DVT prophylaxis: Heparin Code Status: Full code Family Communication: None at  bedside  disposition Plan:  Status is: Inpatient   Dispo: The patient is from: Home              Anticipated d/c is to: Home              Anticipated d/c date is 1 day              Patient currently is not medically stable to d/c.  Patient admitted with acute submassive PE status post catheter directed thrombolysis on IV heparin.  Patient was seen by physical therapy recommended CIR. however CIR has not accepted the patient yet.  I am expecting her to be medically stable for discharge in 1 day    Consultants:   PCCM and interventional radiology  Procedures: Thrombolysis 09/13/2019 Antimicrobials: None Subjective: She is resting in bed she is awake and alert complaining of this new onset right shoulder pain she is unable to move her hand function right upper extremity.  Denies falls or hitting her shoulder.  Objective: Vitals:   09/17/19 0350 09/17/19 1200 09/17/19 1925 09/18/19 0355  BP: 138/74 121/69 (!) 142/67 136/77  Pulse: 82 90 91 84  Resp: 18 17 19 15   Temp: 98.5 F (36.9 C) 98.4 F (36.9 C) 99.1 F (37.3 C) 98.7 F (37.1 C)  TempSrc:  Oral    SpO2: 90% 96% 92% 93%  Weight: 87.5 kg   88.9 kg  Height:        Intake/Output Summary (Last 24 hours) at 09/18/2019 1103 Last data filed at 09/18/2019 0900 Gross per 24 hour  Intake 420 ml  Output 3350 ml  Net -2930 ml  Filed Weights   09/16/19 0422 09/17/19 0350 09/18/19 0355  Weight: 90.7 kg 87.5 kg 88.9 kg    Examination:  General exam: Appears calm and comfortable  Respiratory system: Clear to auscultation. Respiratory effort normal. Cardiovascular system: S1 & S2 heard, RRR. No JVD, murmurs, rubs, gallops or clicks. No pedal edema. Gastrointestinal system: Abdomen is nondistended, soft and nontender. No organomegaly or masses felt. Normal bowel sounds heard. Central nervous system: Alert and oriented. No focal neurological deficits. Extremities: Bilateral lower extremity edema left more than right Right shoulder  with decreased range of motion tenderness and pain Skin: No rashes, lesions or ulcers Psychiatry: Judgement and insight appear normal. Mood & affect appropriate.     Data Reviewed: I have personally reviewed following labs and imaging studies  CBC: Recent Labs  Lab 09/14/19 1649 09/14/19 1649 09/15/19 0147 09/16/19 0241 09/16/19 2149 09/17/19 0456 09/18/19 0504  WBC 7.9  --  8.0 7.0  --  6.8 7.5  HGB 9.7*   < > 8.9* 7.9* 9.1* 8.9* 9.6*  HCT 30.6*   < > 28.5* 25.5* 28.2* 28.4* 30.2*  MCV 92.4  --  92.8 93.8  --  93.7 94.4  PLT 119*  --  131* 158  --  185 225   < > = values in this interval not displayed.   Basic Metabolic Panel: Recent Labs  Lab 09/13/19 0517 09/15/19 1325 09/16/19 0241 09/17/19 0456 09/18/19 0504  NA 138 140 143 140 139  K 5.1 3.9 3.8 3.7 4.1  CL 110 107 110 106 105  CO2 22 23 25 26 26   GLUCOSE 122* 117* 110* 111* 119*  BUN 13 12 11  7* 8  CREATININE 0.86 0.93 0.83 0.77 0.87  CALCIUM 7.1* 7.5* 7.6* 8.0* 8.2*  MG 2.0  --   --   --   --   PHOS 2.8  --   --   --   --    GFR: Estimated Creatinine Clearance: 71.2 mL/min (by C-G formula based on SCr of 0.87 mg/dL). Liver Function Tests: No results for input(s): AST, ALT, ALKPHOS, BILITOT, PROT, ALBUMIN in the last 168 hours. No results for input(s): LIPASE, AMYLASE in the last 168 hours. No results for input(s): AMMONIA in the last 168 hours. Coagulation Profile: Recent Labs  Lab 09/12/19 1755  INR 1.1   Cardiac Enzymes: No results for input(s): CKTOTAL, CKMB, CKMBINDEX, TROPONINI in the last 168 hours. BNP (last 3 results) No results for input(s): PROBNP in the last 8760 hours. HbA1C: No results for input(s): HGBA1C in the last 72 hours. CBG: Recent Labs  Lab 09/12/19 2207  GLUCAP 97   Lipid Profile: No results for input(s): CHOL, HDL, LDLCALC, TRIG, CHOLHDL, LDLDIRECT in the last 72 hours. Thyroid Function Tests: No results for input(s): TSH, T4TOTAL, FREET4, T3FREE, THYROIDAB in the  last 72 hours. Anemia Panel: No results for input(s): VITAMINB12, FOLATE, FERRITIN, TIBC, IRON, RETICCTPCT in the last 72 hours. Sepsis Labs: Recent Labs  Lab 09/12/19 1256 09/12/19 1400 09/12/19 2301  PROCALCITON <0.10  --   --   LATICACIDVEN  --  1.8 1.7    Recent Results (from the past 240 hour(s))  Blood Culture (routine x 2)     Status: None   Collection Time: 09/12/19  1:40 PM   Specimen: BLOOD  Result Value Ref Range Status   Specimen Description BLOOD SITE NOT SPECIFIED  Final   Special Requests   Final    BOTTLES DRAWN AEROBIC ONLY Blood Culture adequate  volume   Culture   Final    NO GROWTH 5 DAYS Performed at [facility], [address] [place], [place] [address]    Report Status 09/17/2019 FINAL  Final  SARS Coronavirus 2 by RT PCR (hospital order, performed in [facility]) Nasopharyngeal Nasopharyngeal Swab     Status: None   Collection Time: 09/12/19  2:00 PM   Specimen: Nasopharyngeal Swab  Result Value Ref Range Status   SARS Coronavirus 2 NEGATIVE NEGATIVE Final    Comment: (NOTE) SARS-CoV-2 target nucleic acids are NOT DETECTED.  The SARS-CoV-2 RNA is generally detectable in upper and lower respiratory specimens during the acute phase of infection. The lowest concentration of SARS-CoV-2 viral copies this assay can detect is 250 copies / mL. A negative result does not preclude SARS-CoV-2 infection and should not be used as the sole basis for treatment or other patient management decisions.  A negative result may occur with improper specimen collection / handling, submission of specimen other than nasopharyngeal swab, presence of viral mutation(s) within the areas targeted by this assay, and inadequate number of viral copies (<250 copies / mL). A negative result must be combined with clinical observations, patient history, and epidemiological information.  Fact Sheet for Patients:   BoilerBrush.com.cyhttps://www.fda.gov/media/136312/download  Fact  Sheet for Healthcare Providers: https://pope.com/https://www.fda.gov/media/136313/download  This test is not yet approved or  cleared by the [place] FDA and has been authorized for detection and/or diagnosis of SARS-CoV-2 by FDA under an Emergency Use Authorization (EUA).  This EUA will remain in effect (meaning this test can be used) for the duration of the COVID-19 declaration under Section 564(b)(1) of the Act, 21 U.S.C. section 360bbb-3(b)(1), unless the authorization is terminated or revoked sooner.  Performed at [facility], [address] [place], [place] [address]   MRSA PCR Screening     Status: None   Collection Time: 09/12/19 10:00 PM   Specimen: Nasal Mucosa; Nasopharyngeal  Result Value Ref Range Status   MRSA by PCR NEGATIVE NEGATIVE Final    Comment:        The GeneXpert MRSA Assay (FDA approved for NASAL specimens only), is one component of a comprehensive MRSA colonization surveillance program. It is not intended to diagnose MRSA infection nor to guide or monitor treatment for MRSA infections. Performed at [facility], [address] [place], [place] [address]   Blood Culture (routine x 2)     Status: None   Collection Time: 09/13/19 12:37 AM   Specimen: BLOOD LEFT ARM  Result Value Ref Range Status   Specimen Description BLOOD LEFT ARM  Final   Special Requests   Final    BOTTLES DRAWN AEROBIC AND ANAEROBIC Blood Culture results may not be optimal due to an excessive volume of blood received in culture bottles   Culture   Final    NO GROWTH 5 DAYS Performed at [facility], [address] [place], [place] [address]    Report Status 09/18/2019 FINAL  Final         Radiology Studies: DG Shoulder Right  Result Date: 09/18/2019 CLINICAL DATA:  Fall 1 week ago.  Pain and loss of range of motion EXAM: RIGHT SHOULDER - 2+ VIEW COMPARISON:  None. FINDINGS: There is no evidence of fracture or dislocation. There is no evidence of arthropathy or  other focal bone abnormality. Soft tissues are unremarkable. IMPRESSION: Negative. Electronically Signed   By: Veronda Prudeaylor  Stroud M.D.  On: 09/18/2019 10:30        Scheduled Meds: . sodium chloride   Intravenous Once  . atorvastatin  20 mg Oral Daily  . Chlorhexidine Gluconate Cloth  6 each Topical Daily  . FLUoxetine  60 mg Oral Daily  . gabapentin  300 mg Oral BID  . ibuprofen  400 mg Oral TID  . mouth rinse  15 mL Mouth Rinse BID  . polyethylene glycol  17 g Oral Daily  . primidone  250 mg Oral BID  . Rivaroxaban  15 mg Oral BID WC   Followed by  . [START ON 10/02/2019] rivaroxaban  20 mg Oral Q supper  . sodium chloride flush  3 mL Intravenous Q12H  . sodium chloride flush  3 mL Intravenous Q12H  . Thrombi-Pad  1 each Topical Once   Continuous Infusions: . sodium chloride    . sodium chloride    . sodium chloride Stopped (09/15/19 0515)  . sodium chloride Stopped (09/13/19 1013)  . lactated ringers 100 mL/hr (09/17/19 1233)     LOS: 6 days     Alwyn Ren, MD 09/18/2019, 11:03 AM

## 2019-09-19 LAB — CBC
HCT: 31.1 % — ABNORMAL LOW (ref 36.0–46.0)
Hemoglobin: 9.6 g/dL — ABNORMAL LOW (ref 12.0–15.0)
MCH: 29.5 pg (ref 26.0–34.0)
MCHC: 30.9 g/dL (ref 30.0–36.0)
MCV: 95.7 fL (ref 80.0–100.0)
Platelets: 277 10*3/uL (ref 150–400)
RBC: 3.25 MIL/uL — ABNORMAL LOW (ref 3.87–5.11)
RDW: 16.6 % — ABNORMAL HIGH (ref 11.5–15.5)
WBC: 8.9 10*3/uL (ref 4.0–10.5)
nRBC: 0.2 % (ref 0.0–0.2)

## 2019-09-19 LAB — BASIC METABOLIC PANEL
Anion gap: 9 (ref 5–15)
BUN: 12 mg/dL (ref 8–23)
CO2: 25 mmol/L (ref 22–32)
Calcium: 8.1 mg/dL — ABNORMAL LOW (ref 8.9–10.3)
Chloride: 104 mmol/L (ref 98–111)
Creatinine, Ser: 0.84 mg/dL (ref 0.44–1.00)
GFR calc Af Amer: >60 mL/min (ref >60)
GFR calc non Af Amer: >60 mL/min (ref >60)
Glucose, Bld: 106 mg/dL — ABNORMAL HIGH (ref 70–99)
Potassium: 4.4 mmol/L (ref 3.5–5.1)
Sodium: 138 mmol/L (ref 135–145)

## 2019-09-19 MED ORDER — POLYETHYLENE GLYCOL 3350 17 G PO PACK: 17.0000 g | PACK | Freq: Every day | ORAL | 0 refills | Status: DC

## 2019-09-19 MED ORDER — RIVAROXABAN 15 MG PO TABS
15.0000 mg | ORAL_TABLET | Freq: Two times a day (BID) | ORAL | 0 refills | Status: DC
Start: 2019-09-19 — End: 2019-09-19

## 2019-09-19 MED ORDER — DOCUSATE SODIUM 100 MG PO CAPS: 100.0000 mg | ORAL_CAPSULE | Freq: Two times a day (BID) | ORAL | 0 refills | Status: DC | PRN

## 2019-09-19 MED ORDER — RIVAROXABAN (XARELTO) VTE STARTER PACK (15 & 20 MG): ORAL_TABLET | ORAL | 0 refills | Status: DC

## 2019-09-19 MED ORDER — RIVAROXABAN 20 MG PO TABS
20.0000 mg | ORAL_TABLET | Freq: Every day | ORAL | 1 refills | Status: DC
Start: 2019-10-02 — End: 2019-09-19

## 2019-09-19 NOTE — Progress Notes (Signed)
D/C instructions given and reviewed. No questions asked but encouraged to call with any concerns. Tele and IV's removed, tolerated well. Awaiting spouse to arrive for transport home.

## 2019-09-19 NOTE — Progress Notes (Signed)
Physical Therapy Treatment Patient Details Name: Julia Gomez MRN: [id number] DOB: [birth year] Today's Date: 09/19/2019    History of Present Illness 69 yo female with onset of posterior fall at home sustained a fracture of skull, now admitted after beginning to have progressive SOB.  Had noted submassive B PE's with LLE DVT, received pulm artery catheter directed thrombolysis, able to mitigate her clots.  IV heparin going, referred to PT.   PMHx:  asthma, depression, thyroid disease    PT Comments    Patient progressing well towards PT goals. Tolerated gait training today with Min guard assist for balance/safety with use of RW for support. 1 seated rest break needed due to fatigue. VSS on RA. Reports cramping in LLE. Education re: placing chairs along house for rest breaks as needed and the importance of short bouts of activity with longer rest breaks. Discharge recommendation updated to home with HHPT services. DME recommended below. Will follow.   Follow Up Recommendations  Home health PT;Supervision for mobility/OOB     Equipment Recommendations  Rolling walker with 5" wheels;3in1 (PT)    Recommendations for Other Services       Precautions / Restrictions Precautions Precautions: Fall Restrictions Weight Bearing Restrictions: No    Mobility  Bed Mobility Overal bed mobility: Needs Assistance Bed Mobility: Supine to Sit;Sit to Supine     Supine to sit: Min guard;HOB elevated Sit to supine: Min guard;HOB elevated   General bed mobility comments: Increased time to get to EOB with use of rail, no assist needed. Able to return to supine with cues.  Transfers Overall transfer level: Needs assistance Equipment used: Rolling walker (2 wheeled) Transfers: Sit to/from Stand Sit to Stand: Min assist;Min guard         General transfer comment: Initially Min A progressing to Min guard to stand from all surfaces with cues for hand placement; stood from EOB x1, from chair  x2.  Ambulation/Gait Ambulation/Gait assistance: Min guard Gait Distance (Feet): 200 Feet Assistive device: Rolling walker (2 wheeled) Gait Pattern/deviations: Step-to pattern;Step-through pattern;Decreased stride length;Decreased weight shift to left;Antalgic Gait velocity: decreased Gait velocity interpretation: <1.31 ft/sec, indicative of household ambulator General Gait Details: Initially step to gait with pain through LLE due to cramp progressing to step through gait with cues for RW proximity. 1 seated rest break. Sp02 remained >95% on RA.   Stairs             Wheelchair Mobility    Modified Rankin (Stroke Patients Only)       Balance Overall balance assessment: Needs assistance Sitting-balance support: Feet supported;No upper extremity supported Sitting balance-Leahy Scale: Fair     Standing balance support: During functional activity Standing balance-Leahy Scale: Poor Standing balance comment: Requries UE support in standing.                            Cognition Arousal/Alertness: Awake/alert Behavior During Therapy: WFL for tasks assessed/performed Overall Cognitive Status: No family/caregiver present to determine baseline cognitive functioning                                 General Comments: Requires repetition at times; slow processing.      Exercises      General Comments        Pertinent Vitals/Pain Pain Assessment: Faces Faces Pain Scale: Hurts even more Pain Location: left calf with mobility Pain Descriptors /  Indicators: Cramping Pain Intervention(s): Monitored during session;Repositioned    Home Living                      Prior Function            PT Goals (current goals can now be found in the care plan section) Progress towards PT goals: Progressing toward goals    Frequency    Min 3X/week      PT Plan Discharge plan needs to be updated    Co-evaluation              AM-PAC  PT "6 Clicks" Mobility   Outcome Measure  Help needed turning from your back to your side while in a flat bed without using bedrails?: A Little Help needed moving from lying on your back to sitting on the side of a flat bed without using bedrails?: A Little Help needed moving to and from a bed to a chair (including a wheelchair)?: A Little Help needed standing up from a chair using your arms (e.g., wheelchair or bedside chair)?: A Little Help needed to walk in hospital room?: A Little Help needed climbing 3-5 steps with a railing? : A Lot 6 Click Score: 17    End of Session Equipment Utilized During Treatment: Gait belt Activity Tolerance: Patient tolerated treatment well Patient left: in bed;with call bell/phone within reach Nurse Communication: Mobility status PT Visit Diagnosis: Unsteadiness on feet (R26.81);Muscle weakness (generalized) (M62.81);Difficulty in walking, not elsewhere classified (R26.2)     Time: 1751-0258 PT Time Calculation (min) (ACUTE ONLY): 32 min  Charges:  $Gait Training: 23-37 mins                     Vale Haven, PT, DPT Acute Rehabilitation Services Pager [phone] Office [phone]       Blake Divine A Lanier Ensign 09/19/2019, 12:31 PM

## 2019-09-19 NOTE — TOC Transition Note (Signed)
Transition of Care [facility]) - CM/SW Discharge Note   Patient Details  Name: Julia Gomez MRN: [id number] Date of Birth: 1950-04-03  Transition of Care [facility]) CM/SW Contact:  Leone Haven, RN Phone Number: 09/19/2019, 3:00 PM   Clinical Narrative:    NCM informed by CSW , Cherish , patient wants to go home with [facility] services.  NCM offered choice, she states she does not have a preference.  NCM made referral to Tiffany at [facility] for HHPT, she is able to take referral.  Soc will begin 24 to 48 hrs post dc.  NCM made referral to Debbie with [employer] for rolling walker and 3 n 1, this will be brought up to her room prior to dc.   Final next level of care: Home w Home Health Services Barriers to Discharge: No Barriers Identified   Patient Goals and CMS Choice Patient states their goals for this hospitalization and ongoing recovery are:: Get better CMS Medicare.gov Compare Post Acute Care list provided to:: Patient Choice offered to / list presented to : Patient  Discharge Placement                       Discharge Plan and Services In-house Referral: Clinical Social Work              DME Arranged: Dan Humphreys rolling, 3-N-1 DME Agency: [employer] Date DME Agency Contacted: 09/19/19 Time DME Agency Contacted: [phone] Representative spoke with at DME Agency: Eunice Blase HH Arranged: PT HH Agency: [facility] Date HH Agency Contacted: 09/19/19 Time HH Agency Contacted: [phone] Representative spoke with at [facility] Agency: Tiffany  Social Determinants of Health (SDOH) Interventions     Readmission Risk Interventions No flowsheet data found.

## 2019-09-19 NOTE — Care Management Important Message (Signed)
Important Message  Patient Details  Name: Julia Gomez MRN: [id number] Date of Birth: [birth year]   Medicare Important Message Given:  Yes     Renie Ora 09/19/2019, 10:15 AM

## 2019-09-19 NOTE — Discharge Summary (Signed)
Physician Discharge Summary  Elizebath Wever FAO:[id number] DOB: 1950-03-25 DOA: 09/12/2019  PCP: Patient, No Pcp Per  Admit date: 09/12/2019 Discharge date: 09/19/2019  Admitted From: Home Disposition: Home Recommendations for Outpatient Follow-up:  1. Follow up with PCP in 1-2 weeks 2. Please obtain BMP/CBC in one week  Home Health:NO Equipment/Devices:NONE  Discharge Condition:STABLE CODE STATUS:FULL Diet recommendation:CARDIAC Brief/Interim Summary:69 year old female admitted with submassive PE status post catheter-based thrombolysis by interventional radiology.  On heparin drip. Work-up included echocardiogram with right ventricular failure and hypokinesis Left lower extremity extensive DVT Head CT left posterior convexity scalp hematoma without underlying skull fracture. Cervical CT no acute traumatic injury noted. Covid negative blood culture negative CT angiogram with acute PE with a large saddle embolism.   Discharge Diagnoses:  Active Problems:   PE (pulmonary thromboembolism) (HCC)   Pulmonary embolism (HCC)   Pain   #1 acute pulmonary embolism/submassive PE status post catheter guided thrombolysis by IR.  Patient was initially treated with heparin and then Xarelto was started 09/17/2019.  No new bleeding or drainage noted from the catheter site.  #2 AKI resolved with IV hydration follow-up labs in a.m. creatinine 0.83.  #3 acute blood loss anemia-she received 1 unit of packed RBC during this hospital stay.  Her hemoglobin on admission was 13.3 which trended down to 7.9.  Today her hemoglobin is 9.6. Marland Kitchen  #4 left lower extremity extensive DVT continue Xarelto  #5 history of depression continue Prozac  #6 hyperlipidemia on Lipitor 20 mg daily.  #7 right shoulder pain question frozen shoulder/adhesive capsulitis plain x-ray shows no evidence of acute injury patient is unable to extend or make any movements of the right shoulder with increased pain with movement.    MRI right shoulder-Intramuscular edema within the supraspinatus, infraspinatus, and lateral deltoid muscles. Findings are nonspecific but can be seen secondary to muscle strain.  Intact rotator cuff.  Mild acromioclavicular osteoarthritis. Small glenohumeral joint effusion. Continue physical therapy heat or cold to the right shoulder  Estimated body mass index is 31 kg/m as calculated from the following:   Height as of this encounter: 5' 6.5" (1.689 m).   Weight as of this encounter: 88.5 kg.  Discharge Instructions  Discharge Instructions    Call MD for:  difficulty breathing, headache or visual disturbances   Complete by: As directed    Call MD for:  redness, tenderness, or signs of infection (pain, swelling, redness, odor or green/yellow discharge around incision site)   Complete by: As directed    Diet - low sodium heart healthy   Complete by: As directed    Increase activity slowly   Complete by: As directed      Allergies as of 09/19/2019      Reactions   Codeine Other (See Comments)   numbness      Medication List    TAKE these medications   atorvastatin 20 MG tablet Commonly known as: LIPITOR Take 20 mg by mouth at bedtime.   Biotin 10 MG Caps Take 1 capsule by mouth daily.   CALCIUM 1200 PO Take 1 tablet by mouth daily.   docusate sodium 100 MG capsule Commonly known as: COLACE Take 1 capsule (100 mg total) by mouth 2 (two) times daily as needed for mild constipation.   fish oil-omega-3 fatty acids 1000 MG capsule Take 1 g by mouth daily.   FLUoxetine 20 MG tablet Commonly known as: PROZAC Take 60 mg by mouth in the morning.   gabapentin 300 MG capsule Commonly known  as: NEURONTIN Take 300 mg by mouth 2 (two) times daily.   polyethylene glycol 17 g packet Commonly known as: MIRALAX / GLYCOLAX Take 17 g by mouth daily.   primidone 250 MG tablet Commonly known as: MYSOLINE Take 250 mg by mouth in the morning and at bedtime.   Rivaroxaban  Stater Pack (15 mg and 20 mg) Commonly known as: XARELTO STARTER PACK Follow package directions: Take one $Remove'15mg'bQRfzDB$  tablet by mouth twice a day. On day 22, switch to one $Remo'20mg'ohnyL$  tablet once a day. Take with food.   trospium 20 MG tablet Commonly known as: SANCTURA Take 20 mg by mouth 2 (two) times daily.       Allergies  Allergen Reactions  . Codeine Other (See Comments)    numbness    Consultations: PCCM  Procedures/Studies: DG Shoulder Right  Result Date: 09/18/2019 CLINICAL DATA:  Fall 1 week ago.  Pain and loss of range of motion EXAM: RIGHT SHOULDER - 2+ VIEW COMPARISON:  None. FINDINGS: There is no evidence of fracture or dislocation. There is no evidence of arthropathy or other focal bone abnormality. Soft tissues are unremarkable. IMPRESSION: Negative. Electronically Signed   By: Kerby Moors M.D.   On: 09/18/2019 10:30   CT Head Wo Contrast  Result Date: 09/12/2019 CLINICAL DATA:  68 year old female status post fall, trauma. Dizziness and shortness of breath. EXAM: CT HEAD WITHOUT CONTRAST TECHNIQUE: Contiguous axial images were obtained from the base of the skull through the vertex without intravenous contrast. COMPARISON:  None. FINDINGS: Brain: Cerebral volume is within normal limits for age. No midline shift, ventriculomegaly, mass effect, evidence of mass lesion, intracranial hemorrhage or evidence of cortically based acute infarction. Gray-white matter differentiation is within normal limits throughout the brain. Vascular: Calcified atherosclerosis at the skull base. No suspicious intracranial vascular hyperdensity. Skull: No fracture identified.  Normal bone mineralization. Sinuses/Orbits: Minimal mucosal thickening and bubbly opacity in the left sphenoid sinus. Other visualized paranasal sinuses and mastoids are well pneumatized. Other: Left posterior convexity mild scalp hematoma (series 4, image 41). Underlying calvarium intact. No other orbit or scalp soft tissue injury  identified. IMPRESSION: 1. Left posterior convexity scalp hematoma without underlying skull fracture. 2. Normal for age non contrast CT appearance of the brain. Electronically Signed   By: Genevie Ann M.D.   On: 09/12/2019 16:24   CT Angio Chest PE W/Cm &/Or Wo Cm  Result Date: 09/12/2019 CLINICAL DATA:  69 year old female status post fall, trauma. Dizziness and shortness of breath. Negative for COVID-19 today. EXAM: CT ANGIOGRAPHY CHEST WITH CONTRAST TECHNIQUE: Multidetector CT imaging of the chest was performed using the standard protocol during bolus administration of intravenous contrast. Multiplanar CT image reconstructions and MIPs were obtained to evaluate the vascular anatomy. CONTRAST:  5mL OMNIPAQUE IOHEXOL 350 MG/ML SOLN COMPARISON:  Cervical spine CT today reported separately. FINDINGS: Cardiovascular: Good contrast bolus timing in the pulmonary arterial tree. Positive pulmonary emboli including saddle embolus (series 8, image 67). Thrombus in both main pulmonary arteries, and involving all bilateral lobar branches. Abnormal RV to LV ratio (series 6, image 185) compatible with right heart strain. There is a small superimposed pericardial effusion. Negative visible aorta aside from mild atherosclerosis. Faint calcified coronary artery atherosclerosis (series 6, image 147). Mediastinum/Nodes: Negative.  No lymphadenopathy. Lungs/Pleura: Trace bilateral layering pleural effusions. Major airways are patent. Mild dependent and perihilar atelectasis. No confluent pulmonary infarct at this time. Upper Abdomen: Negative visible liver, gallbladder, pancreas, adrenal glands, left kidney and bowel. Heterogeneous enhancement of  the spleen which can be seen with early contrast timing due to red pulp/white pulp perfusion differences. However, the confluent hypodensity seen on series 5, image 101 is atypical. Still, there is no perisplenic inflammation or fluid evident. Musculoskeletal: No acute or suspicious osseous  lesion. Review of the MIP images confirms the above findings. IMPRESSION: 1. Positive for Acute PE with saddle embolus, a large clot burden, and CT evidence of right heart strain. 2. Small pericardial effusion and pleural effusions. No pulmonary infarct at this time. 3. Heterogeneous enhancement of the spleen, somewhat atypical for the physiologic early splenic enhancement. However, the absence of perisplenic inflammation or fluid argues against acute splenic infarcts. Critical Value/emergent results were called by telephone at the time of interpretation on 09/12/2019 at 4:33 pm to [place] who verbally acknowledged these results. Electronically Signed   By: Genevie Ann M.D.   On: 09/12/2019 16:46   CT Cervical Spine Wo Contrast  Result Date: 09/12/2019 CLINICAL DATA:  69 year old female status post fall, trauma. Dizziness and shortness of breath. EXAM: CT CERVICAL SPINE WITHOUT CONTRAST TECHNIQUE: Multidetector CT imaging of the cervical spine was performed without intravenous contrast. Multiplanar CT image reconstructions were also generated. COMPARISON:  Head CT today reported separately. FINDINGS: Alignment: Straightening of cervical lordosis. Mild degenerative appearing retrolisthesis of C6 on C7. Cervicothoracic junction alignment is within normal limits. Bilateral posterior element alignment is within normal limits. Skull base and vertebrae: Visualized skull base is intact. No atlanto-occipital dissociation. No acute osseous abnormality identified. Soft tissues and spinal canal: No prevertebral fluid or swelling. No visible canal hematoma. Surgical clips about the left thyroid compatible with prior surgery. Otherwise negative noncontrast visible neck soft tissues. Disc levels: Widespread right side cervical facet arthropathy, lower cervical disc and endplate degeneration. 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 Subsequently, mild spinal stenosis is  suspected at C4-C5. Upper chest: Visible upper thoracic levels appear intact. Calcified aortic atherosclerosis. Negative visible lung apices. IMPRESSION: 1. No acute traumatic injury identified in the cervical spine. 2. 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 Mild degenerative spinal stenosis suspected. Electronically Signed   By: Genevie Ann M.D.   On: 09/12/2019 16:28   IR Angiogram Pulmonary Bilateral Selective  Result Date: 09/13/2019 INDICATION: Sub massive pulmonary embolism. Patient presents for initiation of bilateral pulmonary arterial catheter directed thrombolysis. EXAM: 1. ULTRASOUND GUIDANCE FOR VENOUS ACCESS X2 2. PULMONARY ARTERIOGRAPHY 3. FLUOROSCOPIC GUIDED PLACEMENT OF BILATERAL PULMONARY ARTERIAL LYTIC INFUSION CATHETERS COMPARISON:  Chest CTA-earlier same day MEDICATIONS: None ANESTHESIA/SEDATION: Moderate (conscious) sedation was employed during this procedure. A total of Versed 1 mg and Fentanyl 100 mcg was administered intravenously. Moderate Sedation Time: 35 minutes. The patient's level of consciousness and vital signs were monitored continuously by radiology nursing throughout the procedure under my direct supervision. CONTRAST:  33mL OMNIPAQUE IOHEXOL 300 MG/ML  SOLN FLUOROSCOPY TIME:  9 minutes, 12 seconds (96 mGy) COMPLICATIONS: None immediate. TECHNIQUE: Informed written consent was obtained from the patient after a discussion of the risks, benefits and alternatives to treatment. Questions regarding the procedure were encouraged and answered. A timeout was performed prior to the initiation of the procedure. Ultrasound scanning was performed of the right groin and demonstrated wide patency of the right common femoral vein. As such, the right common femoral vein was selected venous access. The right groin was prepped and draped in the usual sterile fashion, and a sterile drape was applied covering the  operative field.  Maximum barrier sterile technique with sterile gowns and gloves were used for the procedure. A timeout was performed prior to the initiation of the procedure. Local anesthesia was provided with 1% lidocaine. Under direct ultrasound guidance, the right common femoral vein was accessed with a micro puncture kit ultimately allowing placement of a 6 French, 35 cm vascular sheath. Slightly cranial to this initial access, the right common femoral was again accessed with an additional micropuncture kit ultimately allowing placement of an additional 6 French, 35 cm vascular sheath. Ultrasound images were saved for procedural documentation purposes. With the use of a glidewire, a vertebral catheter was advanced into the main pulmonary artery and a limited central pulmonary arteriogram was performed. Pressure measurements were then obtained from the main pulmonary artery. The vertebral catheter was advanced into the distal branch of the right lower lobe pulmonary artery. Limited contrast injection confirmed appropriate positioning. Over an exchange length roadrunner wire, the vertebral catheter was exchanged for a 90/15 cm multi side-hole infusion catheter. With the use of a glidewire, a vertebral catheter was advanced into a distal branch of the left lower lobe pulmonary artery. Limited contrast injection confirmed appropriate positioning. Over an exchange length roadrunner wire, the pigtail catheter was exchanged for a 90/10 cm multi side-hole EKOS ultrasound assisted infusion catheter. A postprocedural fluoroscopic image was obtained to document final catheter positioning. The external catheter tubing was secured at the right thigh and the lytic therapy was initiated. The patient tolerated the procedure well without immediate postprocedural complication. FINDINGS: Limited central pulmonary arteriogram demonstrates enlargement of the main, right and left pulmonary arteries with near occlusive filling defect seen bilaterally  compatible with findings seen on preceding chest CTA. Acquired pressure measurements: Main pulmonary artery-61/32; mean-44 (normal: < 25/10) Following the procedure, both ultrasound assisted infusion catheter tips terminate within the distal aspects of the bilateral lower lobe sub segmental pulmonary arteries. IMPRESSION: 1. Successful fluoroscopic guided initiation of bilateral ultrasound assisted catheter directed pulmonary arterial lysis for sub massive pulmonary embolism and right-sided heart strain. 2. Elevated pressure measurements within the main pulmonary artery compatible with critical pulmonary arterial hypertension. PLAN: - continued monitoring and management per the ICU staff. - morning chest radiograph with bedside pulmonary arterial pressure measurements and catheter removal following 12 hour lytic infusion. Electronically Signed   By: Simonne Come M.D.   On: 09/13/2019 08:42   IR Angiogram Selective Each Additional Vessel  Result Date: 09/13/2019 INDICATION: Sub massive pulmonary embolism. Patient presents for initiation of bilateral pulmonary arterial catheter directed thrombolysis. EXAM: 1. ULTRASOUND GUIDANCE FOR VENOUS ACCESS X2 2. PULMONARY ARTERIOGRAPHY 3. FLUOROSCOPIC GUIDED PLACEMENT OF BILATERAL PULMONARY ARTERIAL LYTIC INFUSION CATHETERS COMPARISON:  Chest CTA-earlier same day MEDICATIONS: None ANESTHESIA/SEDATION: Moderate (conscious) sedation was employed during this procedure. A total of Versed 1 mg and Fentanyl 100 mcg was administered intravenously. Moderate Sedation Time: 35 minutes. The patient's level of consciousness and vital signs were monitored continuously by radiology nursing throughout the procedure under my direct supervision. CONTRAST:  62mL OMNIPAQUE IOHEXOL 300 MG/ML  SOLN FLUOROSCOPY TIME:  9 minutes, 12 seconds (96 mGy) COMPLICATIONS: None immediate. TECHNIQUE: Informed written consent was obtained from the patient after a discussion of the risks, benefits and  alternatives to treatment. Questions regarding the procedure were encouraged and answered. A timeout was performed prior to the initiation of the procedure. Ultrasound scanning was performed of the right groin and demonstrated wide patency of the right common femoral vein. As such, the right common femoral vein was  selected venous access. The right groin was prepped and draped in the usual sterile fashion, and a sterile drape was applied covering the operative field. Maximum barrier sterile technique with sterile gowns and gloves were used for the procedure. A timeout was performed prior to the initiation of the procedure. Local anesthesia was provided with 1% lidocaine. Under direct ultrasound guidance, the right common femoral vein was accessed with a micro puncture kit ultimately allowing placement of a 6 French, 35 cm vascular sheath. Slightly cranial to this initial access, the right common femoral was again accessed with an additional micropuncture kit ultimately allowing placement of an additional 6 French, 35 cm vascular sheath. Ultrasound images were saved for procedural documentation purposes. With the use of a glidewire, a vertebral catheter was advanced into the main pulmonary artery and a limited central pulmonary arteriogram was performed. Pressure measurements were then obtained from the main pulmonary artery. The vertebral catheter was advanced into the distal branch of the right lower lobe pulmonary artery. Limited contrast injection confirmed appropriate positioning. Over an exchange length roadrunner wire, the vertebral catheter was exchanged for a 90/15 cm multi side-hole infusion catheter. With the use of a glidewire, a vertebral catheter was advanced into a distal branch of the left lower lobe pulmonary artery. Limited contrast injection confirmed appropriate positioning. Over an exchange length roadrunner wire, the pigtail catheter was exchanged for a 90/10 cm multi side-hole EKOS ultrasound  assisted infusion catheter. A postprocedural fluoroscopic image was obtained to document final catheter positioning. The external catheter tubing was secured at the right thigh and the lytic therapy was initiated. The patient tolerated the procedure well without immediate postprocedural complication. FINDINGS: Limited central pulmonary arteriogram demonstrates enlargement of the main, right and left pulmonary arteries with near occlusive filling defect seen bilaterally compatible with findings seen on preceding chest CTA. Acquired pressure measurements: Main pulmonary artery-61/32; mean-44 (normal: < 25/10) Following the procedure, both ultrasound assisted infusion catheter tips terminate within the distal aspects of the bilateral lower lobe sub segmental pulmonary arteries. IMPRESSION: 1. Successful fluoroscopic guided initiation of bilateral ultrasound assisted catheter directed pulmonary arterial lysis for sub massive pulmonary embolism and right-sided heart strain. 2. Elevated pressure measurements within the main pulmonary artery compatible with critical pulmonary arterial hypertension. PLAN: - continued monitoring and management per the ICU staff. - morning chest radiograph with bedside pulmonary arterial pressure measurements and catheter removal following 12 hour lytic infusion. Electronically Signed   By: Sandi Mariscal M.D.   On: 09/13/2019 08:42   IR Angiogram Selective Each Additional Vessel  Result Date: 09/13/2019 INDICATION: Sub massive pulmonary embolism. Patient presents for initiation of bilateral pulmonary arterial catheter directed thrombolysis. EXAM: 1. ULTRASOUND GUIDANCE FOR VENOUS ACCESS X2 2. PULMONARY ARTERIOGRAPHY 3. FLUOROSCOPIC GUIDED PLACEMENT OF BILATERAL PULMONARY ARTERIAL LYTIC INFUSION CATHETERS COMPARISON:  Chest CTA-earlier same day MEDICATIONS: None ANESTHESIA/SEDATION: Moderate (conscious) sedation was employed during this procedure. A total of Versed 1 mg and Fentanyl 100 mcg  was administered intravenously. Moderate Sedation Time: 35 minutes. The patient's level of consciousness and vital signs were monitored continuously by radiology nursing throughout the procedure under my direct supervision. CONTRAST:  68mL OMNIPAQUE IOHEXOL 300 MG/ML  SOLN FLUOROSCOPY TIME:  9 minutes, 12 seconds (96 mGy) COMPLICATIONS: None immediate. TECHNIQUE: Informed written consent was obtained from the patient after a discussion of the risks, benefits and alternatives to treatment. Questions regarding the procedure were encouraged and answered. A timeout was performed prior to the initiation of the procedure. Ultrasound scanning was performed  of the right groin and demonstrated wide patency of the right common femoral vein. As such, the right common femoral vein was selected venous access. The right groin was prepped and draped in the usual sterile fashion, and a sterile drape was applied covering the operative field. Maximum barrier sterile technique with sterile gowns and gloves were used for the procedure. A timeout was performed prior to the initiation of the procedure. Local anesthesia was provided with 1% lidocaine. Under direct ultrasound guidance, the right common femoral vein was accessed with a micro puncture kit ultimately allowing placement of a 6 French, 35 cm vascular sheath. Slightly cranial to this initial access, the right common femoral was again accessed with an additional micropuncture kit ultimately allowing placement of an additional 6 French, 35 cm vascular sheath. Ultrasound images were saved for procedural documentation purposes. With the use of a glidewire, a vertebral catheter was advanced into the main pulmonary artery and a limited central pulmonary arteriogram was performed. Pressure measurements were then obtained from the main pulmonary artery. The vertebral catheter was advanced into the distal branch of the right lower lobe pulmonary artery. Limited contrast injection  confirmed appropriate positioning. Over an exchange length roadrunner wire, the vertebral catheter was exchanged for a 90/15 cm multi side-hole infusion catheter. With the use of a glidewire, a vertebral catheter was advanced into a distal branch of the left lower lobe pulmonary artery. Limited contrast injection confirmed appropriate positioning. Over an exchange length roadrunner wire, the pigtail catheter was exchanged for a 90/10 cm multi side-hole EKOS ultrasound assisted infusion catheter. A postprocedural fluoroscopic image was obtained to document final catheter positioning. The external catheter tubing was secured at the right thigh and the lytic therapy was initiated. The patient tolerated the procedure well without immediate postprocedural complication. FINDINGS: Limited central pulmonary arteriogram demonstrates enlargement of the main, right and left pulmonary arteries with near occlusive filling defect seen bilaterally compatible with findings seen on preceding chest CTA. Acquired pressure measurements: Main pulmonary artery-61/32; mean-44 (normal: < 25/10) Following the procedure, both ultrasound assisted infusion catheter tips terminate within the distal aspects of the bilateral lower lobe sub segmental pulmonary arteries. IMPRESSION: 1. Successful fluoroscopic guided initiation of bilateral ultrasound assisted catheter directed pulmonary arterial lysis for sub massive pulmonary embolism and right-sided heart strain. 2. Elevated pressure measurements within the main pulmonary artery compatible with critical pulmonary arterial hypertension. PLAN: - continued monitoring and management per the ICU staff. - morning chest radiograph with bedside pulmonary arterial pressure measurements and catheter removal following 12 hour lytic infusion. Electronically Signed   By: Sandi Mariscal M.D.   On: 09/13/2019 08:42   MR SHOULDER RIGHT WO CONTRAST  Result Date: 09/18/2019 CLINICAL DATA:  Right shoulder pain.   Adhesive capsulitis suspected EXAM: MRI OF THE RIGHT SHOULDER WITHOUT CONTRAST TECHNIQUE: Multiplanar, multisequence MR imaging of the shoulder was performed. No intravenous contrast was administered. COMPARISON:  X-ray 09/18/2019 FINDINGS: Rotator cuff: Intact supraspinatus, infraspinatus, teres minor and subscapularis tendons. Muscles: Intramuscular edema within the supraspinatus, infraspinatus, and lateral deltoid muscles. No intramuscular fluid. Preserved bulk without atrophy or fatty infiltration. Biceps long head:  Intact. Acromioclavicular Joint: Mild arthropathy of the acromioclavicular joint. Trace subacromial-subdeltoid bursal fluid. Glenohumeral Joint: Small glenohumeral joint effusion. No focal cartilage defect. Labrum: Absence of the anterosuperior labrum with cord-like middle glenohumeral ligament suggestive of a Buford complex. No discrete labral tear is evident on non arthrographic imaging. Bones: No acute fracture or dislocation. No focal marrow signal abnormality. No suspicious bone lesion.  Other: None. IMPRESSION: 1. Intramuscular edema within the supraspinatus, infraspinatus, and lateral deltoid muscles. Findings are nonspecific but can be seen secondary to muscle strain. 2. Intact rotator cuff. 3. Mild acromioclavicular osteoarthritis. 4. Small glenohumeral joint effusion. 5. 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 No discrete labral tear is evident. Electronically Signed   By: Davina Poke D.O.   On: 09/18/2019 16:55   IR US Guide Vasc Access Right  Result Date: 09/13/2019 INDICATION: Sub massive pulmonary embolism. Patient presents for initiation of bilateral pulmonary arterial catheter directed thrombolysis. EXAM: 1. ULTRASOUND GUIDANCE FOR VENOUS ACCESS X2 2. PULMONARY ARTERIOGRAPHY 3. FLUOROSCOPIC GUIDED PLACEMENT OF BILATERAL PULMONARY ARTERIAL LYTIC INFUSION CATHETERS COMPARISON:  Chest CTA-earlier same day MEDICATIONS: None ANESTHESIA/SEDATION: Moderate (conscious)  sedation was employed during this procedure. A total of Versed 1 mg and Fentanyl 100 mcg was administered intravenously. Moderate Sedation Time: 35 minutes. The patient's level of consciousness and vital signs were monitored continuously by radiology nursing throughout the procedure under my direct supervision. CONTRAST:  60mL OMNIPAQUE IOHEXOL 300 MG/ML  SOLN FLUOROSCOPY TIME:  9 minutes, 12 seconds (96 mGy) COMPLICATIONS: None immediate. TECHNIQUE: Informed written consent was obtained from the patient after a discussion of the risks, benefits and alternatives to treatment. Questions regarding the procedure were encouraged and answered. A timeout was performed prior to the initiation of the procedure. Ultrasound scanning was performed of the right groin and demonstrated wide patency of the right common femoral vein. As such, the right common femoral vein was selected venous access. The right groin was prepped and draped in the usual sterile fashion, and a sterile drape was applied covering the operative field. Maximum barrier sterile technique with sterile gowns and gloves were used for the procedure. A timeout was performed prior to the initiation of the procedure. Local anesthesia was provided with 1% lidocaine. Under direct ultrasound guidance, the right common femoral vein was accessed with a micro puncture kit ultimately allowing placement of a 6 French, 35 cm vascular sheath. Slightly cranial to this initial access, the right common femoral was again accessed with an additional micropuncture kit ultimately allowing placement of an additional 6 French, 35 cm vascular sheath. Ultrasound images were saved for procedural documentation purposes. With the use of a glidewire, a vertebral catheter was advanced into the main pulmonary artery and a limited central pulmonary arteriogram was performed. Pressure measurements were then obtained from the main pulmonary artery. The vertebral catheter was advanced into the  distal branch of the right lower lobe pulmonary artery. Limited contrast injection confirmed appropriate positioning. Over an exchange length roadrunner wire, the vertebral catheter was exchanged for a 90/15 cm multi side-hole infusion catheter. With the use of a glidewire, a vertebral catheter was advanced into a distal branch of the left lower lobe pulmonary artery. Limited contrast injection confirmed appropriate positioning. Over an exchange length roadrunner wire, the pigtail catheter was exchanged for a 90/10 cm multi side-hole EKOS ultrasound assisted infusion catheter. A postprocedural fluoroscopic image was obtained to document final catheter positioning. The external catheter tubing was secured at the right thigh and the lytic therapy was initiated. The patient tolerated the procedure well without immediate postprocedural complication. FINDINGS: Limited central pulmonary arteriogram demonstrates enlargement of the main, right and left pulmonary arteries with near occlusive filling defect seen bilaterally compatible with findings seen on preceding chest CTA. Acquired pressure measurements: Main pulmonary artery-61/32; mean-44 (normal: < 25/10) Following the procedure, both ultrasound assisted infusion catheter tips terminate within the distal aspects of the  bilateral lower lobe sub segmental pulmonary arteries. IMPRESSION: 1. Successful fluoroscopic guided initiation of bilateral ultrasound assisted catheter directed pulmonary arterial lysis for sub massive pulmonary embolism and right-sided heart strain. 2. Elevated pressure measurements within the main pulmonary artery compatible with critical pulmonary arterial hypertension. PLAN: - continued monitoring and management per the ICU staff. - morning chest radiograph with bedside pulmonary arterial pressure measurements and catheter removal following 12 hour lytic infusion. Electronically Signed   By: Sandi Mariscal M.D.   On: 09/13/2019 08:42   DG Chest Port  1 View  Result Date: 09/13/2019 CLINICAL DATA:  Pulmonary artery catheters EXAM: PORTABLE CHEST 1 VIEW COMPARISON:  Yesterday FINDINGS: Bilateral pulmonary artery infusion catheters. The right-sided catheter tip is 3 cm higher than yesterday. The left-sided catheter is unchanged. No visible adjacent pulmonary hemorrhage. Chronic cardiomegaly. Mild streaky opacity asymmetric to the left. Postoperative thoracic inlet. IMPRESSION: Mild shortening of the right-sided pulmonary artery catheter. Electronically Signed   By: Monte Fantasia M.D.   On: 09/13/2019 08:48   DG Chest Port 1 View  Result Date: 09/12/2019 CLINICAL DATA:  Evaluate pulmonary catheter placement EXAM: PORTABLE CHEST 1 VIEW COMPARISON:  Film from earlier in the same day. FINDINGS: Cardiac shadow is stable. The lungs are clear bilaterally. Infusion catheters are now noted in the pulmonary arteries bilaterally. No sizable effusion or infiltrate is seen. Postsurgical changes in the left neck are noted. IMPRESSION: Pulmonary arterial infusion catheters bilaterally. No acute abnormality noted. Electronically Signed   By: Inez Catalina M.D.   On: 09/12/2019 20:36   DG Chest Port 1 View  Result Date: 09/12/2019 CLINICAL DATA:  Shortness of breath. EXAM: PORTABLE CHEST 1 VIEW COMPARISON:  None. FINDINGS: The heart size and mediastinal contours are within normal limits. Both lungs are clear. No pneumothorax or pleural effusion is noted. The visualized skeletal structures are unremarkable. IMPRESSION: No active disease. Electronically Signed   By: Marijo Conception M.D.   On: 09/12/2019 14:43   ECHOCARDIOGRAM COMPLETE  Result Date: 09/13/2019    ECHOCARDIOGRAM REPORT   Patient Name:   Julia Gomez Date of Exam: 09/13/2019 Medical Rec #:  [id number]     Height:       66.5 in Accession #:    [id number]    Weight:       227.7 lb Date of Birth:  [birth year]     BSA:          2.125 m Patient Age:    69 years      BP:           149/85 mmHg Patient Gender: F              HR:           102 bpm. Exam Location:  Inpatient Procedure: 2D Echo, Cardiac Doppler and Color Doppler STAT ECHO Indications:    Pulmonary Embolus 415.19 / I26.99  History:        Patient has no prior history of Echocardiogram examinations.                 Risk Factors:Non-Smoker.  Sonographer:    Vickie Epley RDCS Referring Phys: [place]  1. Positive McConnell's sign is present. Acute RV failure consistent with submassive PE is present. Right ventricular systolic function is moderately reduced. The right ventricular size is severely enlarged. There is normal pulmonary artery systolic pressure. The estimated right ventricular systolic pressure is 49.7 mmHg.  2. Left ventricular ejection fraction, by  estimation, is 60 to 65%. The left ventricle has normal function. The left ventricle has no regional wall motion abnormalities. Left ventricular diastolic parameters were normal. There is the interventricular septum is flattened in systole and diastole, consistent with right ventricular pressure and volume overload.  3. Left atrial size was mildly dilated.  4. Moderate pericardial effusion. The pericardial effusion is posterior to the left ventricle. There is no evidence of cardiac tamponade.  5. The mitral valve is grossly normal. Trivial mitral valve regurgitation. No evidence of mitral stenosis.  6. The aortic valve is tricuspid. Aortic valve regurgitation is not visualized. Mild aortic valve sclerosis is present, with no evidence of aortic valve stenosis.  7. The inferior vena cava is normal in size with <50% respiratory variability, suggesting right atrial pressure of 8 mmHg. FINDINGS  Left Ventricle: Left ventricular ejection fraction, by estimation, is 60 to 65%. The left ventricle has normal function. The left ventricle has no regional wall motion abnormalities. The left ventricular internal cavity size was normal in size. There is  no left ventricular hypertrophy. The  interventricular septum is flattened in systole and diastole, consistent with right ventricular pressure and volume overload. Left ventricular diastolic parameters were normal. Right Ventricle: Positive McConnell's sign is present. Acute RV failure consistent with submassive PE is present. The right ventricular size is severely enlarged. No increase in right ventricular wall thickness. Right ventricular systolic function is moderately reduced. There is normal pulmonary artery systolic pressure. The tricuspid regurgitant velocity is 2.62 m/s, and with an assumed right atrial pressure of 8 mmHg, the estimated right ventricular systolic pressure is 96.2 mmHg. Left Atrium: Left atrial size was mildly dilated. Right Atrium: Right atrial size was normal in size. Pericardium: A moderately sized pericardial effusion is present. The pericardial effusion is posterior to the left ventricle. There is no evidence of cardiac tamponade. Presence of pericardial fat pad. Mitral Valve: The mitral valve is grossly normal. Trivial mitral valve regurgitation. No evidence of mitral valve stenosis. Tricuspid Valve: The tricuspid valve is grossly normal. Tricuspid valve regurgitation is mild . No evidence of tricuspid stenosis. Aortic Valve: The aortic valve is tricuspid. . There is mild thickening and mild calcification of the aortic valve. Aortic valve regurgitation is not visualized. Mild aortic valve sclerosis is present, with no evidence of aortic valve stenosis. There is mild thickening of the aortic valve. There is mild calcification of the aortic valve. Pulmonic Valve: The pulmonic valve was grossly normal. Pulmonic valve regurgitation is not visualized. No evidence of pulmonic stenosis. Aorta: The aortic root is normal in size and structure. Venous: The inferior vena cava is normal in size with less than 50% respiratory variability, suggesting right atrial pressure of 8 mmHg. IAS/Shunts: The atrial septum is grossly normal.  Additional Comments: A venous catheter is visualized in the inferior vena cava, right atrium and right ventricle.  LEFT VENTRICLE PLAX 2D LVIDd:         4.10 cm     Diastology LVIDs:         2.90 cm     LV e' lateral: 9.46 cm/s LV PW:         0.70 cm     LV e' medial:  9.46 cm/s LV IVS:        0.80 cm LVOT diam:     1.90 cm LV SV:         47 LV SV Index:   22 LVOT Area:     2.84 cm  LV  Volumes (MOD) LV vol d, MOD A2C: 54.8 ml LV vol d, MOD A4C: 52.0 ml LV vol s, MOD A2C: 21.9 ml LV vol s, MOD A4C: 30.8 ml LV SV MOD A2C:     32.9 ml LV SV MOD A4C:     52.0 ml LV SV MOD BP:      27.9 ml RIGHT VENTRICLE TAPSE (M-mode): 1.7 cm LEFT ATRIUM             Index      RIGHT ATRIUM           Index LA diam:        3.20 cm 1.51 cm/m RA Area:     17.20 cm LA Vol (A2C):   21.0 ml 9.88 ml/m RA Volume:   48.30 ml  22.73 ml/m LA Vol (A4C):   20.3 ml 9.55 ml/m LA Biplane Vol: 20.8 ml 9.79 ml/m  AORTIC VALVE LVOT Vmax:   96.80 cm/s LVOT Vmean:  67.900 cm/s LVOT VTI:    0.165 m  AORTA Ao Root diam: 2.60 cm TRICUSPID VALVE TR Peak grad:   27.5 mmHg TR Vmax:        262.00 cm/s  SHUNTS Systemic VTI:  0.16 m Systemic Diam: 1.90 cm Eleonore Chiquito MD Electronically signed by Eleonore Chiquito MD Signature Date/Time: 09/13/2019/10:44:57 AM    Final    VAS [place] LOWER EXTREMITY VENOUS (DVT)  Result Date: 09/14/2019  Lower Venous DVTStudy Indications: Pulmonary embolism.  Performing Technologist: Abram Sander RVS  Examination Guidelines: A complete evaluation includes B-mode imaging, spectral Doppler, color Doppler, and power Doppler as needed of all accessible portions of each vessel. Bilateral testing is considered an integral part of a complete examination. Limited examinations for reoccurring indications may be performed as noted. The reflux portion of the exam is performed with the patient in reverse Trendelenburg.  +---------+---------------+---------+-----------+----------+--------------+ RIGHT     CompressibilityPhasicitySpontaneityPropertiesThrombus Aging +---------+---------------+---------+-----------+----------+--------------+ CFV      Full           Yes      Yes                                 +---------+---------------+---------+-----------+----------+--------------+ SFJ      Full                                                        +---------+---------------+---------+-----------+----------+--------------+ FV Prox  Full                                                        +---------+---------------+---------+-----------+----------+--------------+ FV Mid   Full                                                        +---------+---------------+---------+-----------+----------+--------------+ FV DistalFull                                                        +---------+---------------+---------+-----------+----------+--------------+  PFV      Full                                                        +---------+---------------+---------+-----------+----------+--------------+ POP      Full           Yes      Yes                                 +---------+---------------+---------+-----------+----------+--------------+ PTV      Full                                                        +---------+---------------+---------+-----------+----------+--------------+ PERO     Full                                                        +---------+---------------+---------+-----------+----------+--------------+   +---------+---------------+---------+-----------+----------+--------------+ LEFT     CompressibilityPhasicitySpontaneityPropertiesThrombus Aging +---------+---------------+---------+-----------+----------+--------------+ CFV      Full           Yes      Yes                                 +---------+---------------+---------+-----------+----------+--------------+ SFJ      Full                                                         +---------+---------------+---------+-----------+----------+--------------+ FV Prox  Full                                                        +---------+---------------+---------+-----------+----------+--------------+ FV Mid   Full                                                        +---------+---------------+---------+-----------+----------+--------------+ FV DistalFull                                                        +---------+---------------+---------+-----------+----------+--------------+ PFV      Full                                                        +---------+---------------+---------+-----------+----------+--------------+  POP      None           No       No                   Acute          +---------+---------------+---------+-----------+----------+--------------+ PTV      None                                         Acute          +---------+---------------+---------+-----------+----------+--------------+ PERO     None                                         Acute          +---------+---------------+---------+-----------+----------+--------------+     Summary: RIGHT: - No evidence of deep vein thrombosis in the lower extremity. No indirect evidence of obstruction proximal to the inguinal ligament.  LEFT: - Findings consistent with acute deep vein thrombosis involving the left popliteal vein, left posterior tibial veins, and left peroneal veins. - No cystic structure found in the popliteal fossa.  *See table(s) above for measurements and observations. Electronically signed by Monica Martinez MD on 09/14/2019 at 3:10:59 PM.    Final    IR INFUSION THROMBOL ARTERIAL INITIAL (MS)  Result Date: 09/13/2019 INDICATION: Sub massive pulmonary embolism. Patient presents for initiation of bilateral pulmonary arterial catheter directed thrombolysis. EXAM: 1. ULTRASOUND GUIDANCE FOR VENOUS ACCESS X2 2. PULMONARY ARTERIOGRAPHY 3.  FLUOROSCOPIC GUIDED PLACEMENT OF BILATERAL PULMONARY ARTERIAL LYTIC INFUSION CATHETERS COMPARISON:  Chest CTA-earlier same day MEDICATIONS: None ANESTHESIA/SEDATION: Moderate (conscious) sedation was employed during this procedure. A total of Versed 1 mg and Fentanyl 100 mcg was administered intravenously. Moderate Sedation Time: 35 minutes. The patient's level of consciousness and vital signs were monitored continuously by radiology nursing throughout the procedure under my direct supervision. CONTRAST:  35mL OMNIPAQUE IOHEXOL 300 MG/ML  SOLN FLUOROSCOPY TIME:  9 minutes, 12 seconds (96 mGy) COMPLICATIONS: None immediate. TECHNIQUE: Informed written consent was obtained from the patient after a discussion of the risks, benefits and alternatives to treatment. Questions regarding the procedure were encouraged and answered. A timeout was performed prior to the initiation of the procedure. Ultrasound scanning was performed of the right groin and demonstrated wide patency of the right common femoral vein. As such, the right common femoral vein was selected venous access. The right groin was prepped and draped in the usual sterile fashion, and a sterile drape was applied covering the operative field. Maximum barrier sterile technique with sterile gowns and gloves were used for the procedure. A timeout was performed prior to the initiation of the procedure. Local anesthesia was provided with 1% lidocaine. Under direct ultrasound guidance, the right common femoral vein was accessed with a micro puncture kit ultimately allowing placement of a 6 French, 35 cm vascular sheath. Slightly cranial to this initial access, the right common femoral was again accessed with an additional micropuncture kit ultimately allowing placement of an additional 6 French, 35 cm vascular sheath. Ultrasound images were saved for procedural documentation purposes. With the use of a glidewire, a vertebral catheter was advanced into the main  pulmonary artery and a limited central pulmonary arteriogram was performed. Pressure measurements were then obtained from the main  pulmonary artery. The vertebral catheter was advanced into the distal branch of the right lower lobe pulmonary artery. Limited contrast injection confirmed appropriate positioning. Over an exchange length roadrunner wire, the vertebral catheter was exchanged for a 90/15 cm multi side-hole infusion catheter. With the use of a glidewire, a vertebral catheter was advanced into a distal branch of the left lower lobe pulmonary artery. Limited contrast injection confirmed appropriate positioning. Over an exchange length roadrunner wire, the pigtail catheter was exchanged for a 90/10 cm multi side-hole EKOS ultrasound assisted infusion catheter. A postprocedural fluoroscopic image was obtained to document final catheter positioning. The external catheter tubing was secured at the right thigh and the lytic therapy was initiated. The patient tolerated the procedure well without immediate postprocedural complication. FINDINGS: Limited central pulmonary arteriogram demonstrates enlargement of the main, right and left pulmonary arteries with near occlusive filling defect seen bilaterally compatible with findings seen on preceding chest CTA. Acquired pressure measurements: Main pulmonary artery-61/32; mean-44 (normal: < 25/10) Following the procedure, both ultrasound assisted infusion catheter tips terminate within the distal aspects of the bilateral lower lobe sub segmental pulmonary arteries. IMPRESSION: 1. Successful fluoroscopic guided initiation of bilateral ultrasound assisted catheter directed pulmonary arterial lysis for sub massive pulmonary embolism and right-sided heart strain. 2. Elevated pressure measurements within the main pulmonary artery compatible with critical pulmonary arterial hypertension. PLAN: - continued monitoring and management per the ICU staff. - morning chest radiograph  with bedside pulmonary arterial pressure measurements and catheter removal following 12 hour lytic infusion. Electronically Signed   By: Sandi Mariscal M.D.   On: 09/13/2019 08:42   IR THROMB F/U EVAL ART/VEN FINAL DAY (MS)  Result Date: 09/13/2019 INDICATION: 69 year old female with a history of submassive pulmonary embolism, status post catheter directed lysis EXAM: FOLLOW-UP PULMONARY ARTERY EMBOLISM LYSIS COMPARISON:  CT 09/12/2019, initial angiogram 09/12/2019 MEDICATIONS: None. ANESTHESIA/SEDATION: None FLUOROSCOPY TIME:  None COMPLICATIONS: None TECHNIQUE: Informed written consent was obtained from the patient after a thorough discussion of the procedural risks, benefits and alternatives. All questions were addressed. Maximal Sterile Barrier Technique was utilized including caps, mask, sterile gowns, sterile gloves, sterile drape, hand hygiene and skin antiseptic. A timeout was performed prior to the initiation of the procedure. Clean procedure was used to do bedside trans duction of the right pulmonary artery lytic catheter. Once the pressure measurement was recorded, all catheters and sheaths were removed. Manual pressure was used for hemostasis and clean bandages were placed. Patient tolerated the procedure well and remained hemodynamically stable throughout. No subsequent blood loss. FINDINGS: Initial pressure measurement on yesterday's study main pulmonary artery: 61/32 (44) Follow-up pressure on today's study main pulmonary artery: 30/12 (19) IMPRESSION: Follow-up pulmonary artery lysis demonstrates reduction in the main pulmonary artery measured pressures, status post bilateral pulmonary artery catheter directed lysis. Signed, Dulcy Fanny. Dellia Nims, RPVI [facility] Electronically Signed   By: Corrie Mckusick D.O.   On: 09/13/2019 11:59    (Echo, Carotid, EGD, Colonoscopy, ERCP)    Subjective:  Patient resting in bed awake and alert  anxious to go home still has right shoulder pain but able to move better than yesterday. Discharge Exam: Vitals:   09/19/19 0400 09/19/19 1101  BP:  (!) 146/68  Pulse:    Resp:    Temp: 98.7 F (37.1 C)   SpO2:     Vitals:   09/18/19 2028 09/19/19 0000 09/19/19 0400 09/19/19 1101  BP: 123/66 122/62  (!) 146/68  Pulse: 84 79  Resp: 17 17    Temp: 98 F (36.7 C) 98.5 F (36.9 C) 98.7 F (37.1 C)   TempSrc: Oral Oral Oral   SpO2: 93% 94%    Weight:   88.5 kg   Height:        General: Pt is alert, awake, not in acute distress Cardiovascular: RRR, S1/S2 +, no rubs, no gallops Respiratory: CTA bilaterally, no wheezing, no rhonchi Abdominal: Soft, NT, ND, bowel sounds + Extremities: no edema, no cyanosis    The results of significant diagnostics from this hospitalization (including imaging, microbiology, ancillary and laboratory) are listed below for reference.     Microbiology: Recent Results (from the past 240 hour(s))  Blood Culture (routine x 2)     Status: None   Collection Time: 09/12/19  1:40 PM   Specimen: BLOOD  Result Value Ref Range Status   Specimen Description BLOOD SITE NOT SPECIFIED  Final   Special Requests   Final    BOTTLES DRAWN AEROBIC ONLY Blood Culture adequate volume   Culture   Final    NO GROWTH 5 DAYS Performed at [facility], [address], [place], [place] [address]    Report Status 09/17/2019 FINAL  Final  SARS Coronavirus 2 by RT PCR (hospital order, performed in [facility] hospital lab) Nasopharyngeal Nasopharyngeal Swab     Status: None   Collection Time: 09/12/19  2:00 PM   Specimen: Nasopharyngeal Swab  Result Value Ref Range Status   SARS Coronavirus 2 NEGATIVE NEGATIVE Final    Comment: (NOTE) SARS-CoV-2 target nucleic acids are NOT DETECTED.  The SARS-CoV-2 RNA is generally detectable in upper and lower respiratory specimens during the acute phase of infection. The lowest concentration of SARS-CoV-2 viral copies  this assay can detect is 250 copies / mL. A negative result does not preclude SARS-CoV-2 infection and should not be used as the sole basis for treatment or other patient management decisions.  A negative result may occur with improper specimen collection / handling, submission of specimen other than nasopharyngeal swab, presence of viral mutation(s) within the areas targeted by this assay, and inadequate number of viral copies (<250 copies / mL). A negative result must be combined with clinical observations, patient history, and epidemiological information.  Fact Sheet for Patients:   StrictlyIdeas.no  Fact Sheet for Healthcare Providers: BankingDealers.co.za  This test is not yet approved or  cleared by the [place] FDA and has been authorized for detection and/or diagnosis of SARS-CoV-2 by FDA under an Emergency Use Authorization (EUA).  This EUA will remain in effect (meaning this test can be used) for the duration of the COVID-19 declaration under Section 564(b)(1) of the Act, 21 U.S.C. section 360bbb-3(b)(1), unless the authorization is terminated or revoked sooner.  Performed at [facility], [place] [address] [place], [place] [address]   MRSA PCR Screening     Status: None   Collection Time: 09/12/19 10:00 PM   Specimen: Nasal Mucosa; Nasopharyngeal  Result Value Ref Range Status   MRSA by PCR NEGATIVE NEGATIVE Final    Comment:        The GeneXpert MRSA Assay (FDA approved for NASAL specimens only), is one component of a comprehensive MRSA colonization surveillance program. It is not intended to diagnose MRSA infection nor to guide or monitor treatment for MRSA infections. Performed at [facility], [place] [address], [place], [place] [address]   Blood Culture (routine x 2)     Status: None  Collection Time: 09/13/19 12:37 AM   Specimen: BLOOD LEFT ARM  Result Value Ref Range Status   Specimen  Description BLOOD LEFT ARM  Final   Special Requests   Final    BOTTLES DRAWN AEROBIC AND ANAEROBIC Blood Culture results may not be optimal due to an excessive volume of blood received in culture bottles   Culture   Final    NO GROWTH 5 DAYS Performed at [facility], [place] [address], [place], [place] [address]    Report Status 09/18/2019 FINAL  Final     Labs: BNP (last 3 results) No results for input(s): BNP in the last 8760 hours. Basic Metabolic Panel: Recent Labs  Lab 09/13/19 0517 09/13/19 0517 09/15/19 1325 09/16/19 0241 09/17/19 0456 09/18/19 0504 09/19/19 0725  NA 138   < > 140 143 140 139 138  K 5.1   < > 3.9 3.8 3.7 4.1 4.4  CL 110   < > 107 110 106 105 104  CO2 22   < > $R'23 25 26 26 25  'Rz$ GLUCOSE 122*   < > 117* 110* 111* 119* 106*  BUN 13   < > 12 11 7* 8 12  CREATININE 0.86   < > 0.93 0.83 0.77 0.87 0.84  CALCIUM 7.1*   < > 7.5* 7.6* 8.0* 8.2* 8.1*  MG 2.0  --   --   --   --   --   --   PHOS 2.8  --   --   --   --   --   --    < > = values in this interval not displayed.   Liver Function Tests: No results for input(s): AST, ALT, ALKPHOS, BILITOT, PROT, ALBUMIN in the last 168 hours. No results for input(s): LIPASE, AMYLASE in the last 168 hours. No results for input(s): AMMONIA in the last 168 hours. CBC: Recent Labs  Lab 09/15/19 0147 09/15/19 0147 09/16/19 0241 09/16/19 2149 09/17/19 0456 09/18/19 0504 09/19/19 0725  WBC 8.0  --  7.0  --  6.8 7.5 8.9  HGB 8.9*   < > 7.9* 9.1* 8.9* 9.6* 9.6*  HCT 28.5*   < > 25.5* 28.2* 28.4* 30.2* 31.1*  MCV 92.8  --  93.8  --  93.7 94.4 95.7  PLT 131*  --  158  --  185 225 277   < > = values in this interval not displayed.   Cardiac Enzymes: No results for input(s): CKTOTAL, CKMB, CKMBINDEX, TROPONINI in the last 168 hours. BNP: Invalid input(s): POCBNP CBG: Recent Labs  Lab 09/12/19 2207  GLUCAP 97   D-Dimer No results for input(s): DDIMER in the last 72 hours. Hgb A1c No results for input(s):  HGBA1C in the last 72 hours. Lipid Profile No results for input(s): CHOL, HDL, LDLCALC, TRIG, CHOLHDL, LDLDIRECT in the last 72 hours. Thyroid function studies No results for input(s): TSH, T4TOTAL, T3FREE, THYROIDAB in the last 72 hours.  Invalid input(s): FREET3 Anemia work up No results for input(s): VITAMINB12, FOLATE, FERRITIN, TIBC, IRON, RETICCTPCT in the last 72 hours. Urinalysis No results found for: COLORURINE, APPEARANCEUR, [place], [place], [facility], [place], [place], [place], PROTEINUR, UROBILINOGEN, NITRITE, LEUKOCYTESUR Sepsis Labs Invalid input(s): PROCALCITONIN,  WBC,  LACTICIDVEN Microbiology Recent Results (from the past 240 hour(s))  Blood Culture (routine x 2)     Status: None   Collection Time: 09/12/19  1:40 PM   Specimen: BLOOD  Result Value Ref Range Status   Specimen Description BLOOD SITE  NOT SPECIFIED  Final   Special Requests   Final    BOTTLES DRAWN AEROBIC ONLY Blood Culture adequate volume   Culture   Final    NO GROWTH 5 DAYS Performed at [facility], [address] [place], [place] [address]    Report Status 09/17/2019 FINAL  Final  SARS Coronavirus 2 by RT PCR (hospital order, performed in [facility] hospital lab) Nasopharyngeal Nasopharyngeal Swab     Status: None   Collection Time: 09/12/19  2:00 PM   Specimen: Nasopharyngeal Swab  Result Value Ref Range Status   SARS Coronavirus 2 NEGATIVE NEGATIVE Final    Comment: (NOTE) SARS-CoV-2 target nucleic acids are NOT DETECTED.  The SARS-CoV-2 RNA is generally detectable in upper and lower respiratory specimens during the acute phase of infection. The lowest concentration of SARS-CoV-2 viral copies this assay can detect is 250 copies / mL. A negative result does not preclude SARS-CoV-2 infection and should not be used as the sole basis for treatment or other patient management decisions.  A negative result may occur with improper specimen collection / handling, submission of  specimen other than nasopharyngeal swab, presence of viral mutation(s) within the areas targeted by this assay, and inadequate number of viral copies (<250 copies / mL). A negative result must be combined with clinical observations, patient history, and epidemiological information.  Fact Sheet for Patients:   StrictlyIdeas.no  Fact Sheet for Healthcare Providers: BankingDealers.co.za  This test is not yet approved or  cleared by the [place] FDA and has been authorized for detection and/or diagnosis of SARS-CoV-2 by FDA under an Emergency Use Authorization (EUA).  This EUA will remain in effect (meaning this test can be used) for the duration of the COVID-19 declaration under Section 564(b)(1) of the Act, 21 U.S.C. section 360bbb-3(b)(1), unless the authorization is terminated or revoked sooner.  Performed at [facility], [place] [address] [place], [place] [address]   MRSA PCR Screening     Status: None   Collection Time: 09/12/19 10:00 PM   Specimen: Nasal Mucosa; Nasopharyngeal  Result Value Ref Range Status   MRSA by PCR NEGATIVE NEGATIVE Final    Comment:        The GeneXpert MRSA Assay (FDA approved for NASAL specimens only), is one component of a comprehensive MRSA colonization surveillance program. It is not intended to diagnose MRSA infection nor to guide or monitor treatment for MRSA infections. Performed at [facility], [place] [address] [place], [place] [address]   Blood Culture (routine x 2)     Status: None   Collection Time: 09/13/19 12:37 AM   Specimen: BLOOD LEFT ARM  Result Value Ref Range Status   Specimen Description BLOOD LEFT ARM  Final   Special Requests   Final    BOTTLES DRAWN AEROBIC AND ANAEROBIC Blood Culture results may not be optimal due to an excessive volume of blood received in culture bottles   Culture   Final    NO GROWTH 5 DAYS Performed at [facility], [place]  [address] [place], [place] [address]    Report Status 09/18/2019 FINAL  Final     Time coordinating discharge:  38 minutes  SIGNED:   Georgette Shell, MD  [facility] 09/19/2019, 11:43 AM

## 2021-06-15 IMAGING — CT CT ANGIO CHEST
2 of 6 series · 18 of 36 positions shown · IV contrast (omnipaque)
Comparison: Cervical spine CT today reported separately.

CLINICAL DATA: 68-year-old female status post fall, trauma.
Dizziness and shortness of breath.

Negative for [id number] today.
EXAM:
CT ANGIOGRAPHY CHEST WITH CONTRAST
TECHNIQUE: Multidetector CT imaging of the chest was performed using the
standard protocol during bolus administration of intravenous
contrast. Multiplanar CT image reconstructions and MIPs were
obtained to evaluate the vascular anatomy.
CONTRAST:  60mL OMNIPAQUE IOHEXOL 350 MG/ML SOLN

[Series 6: thins · axial · 0.98mm/px · z∈[-575,-286]mm · 17 of 323 slices shown]
[im 17/323  lung]
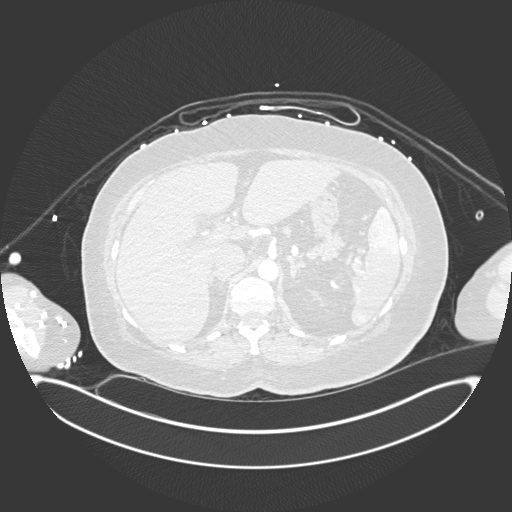
[im 33/323  mediastinal]
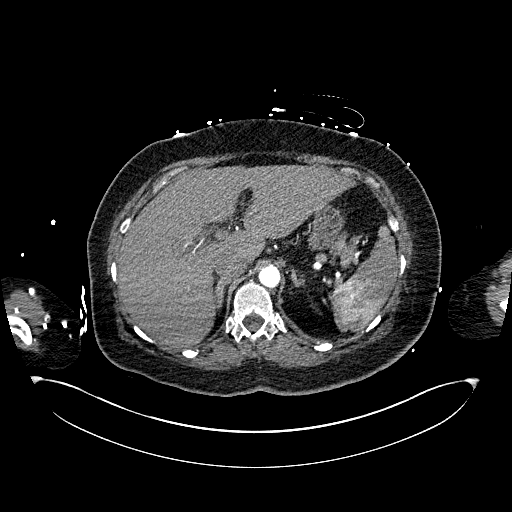
[im 49/323  lung]
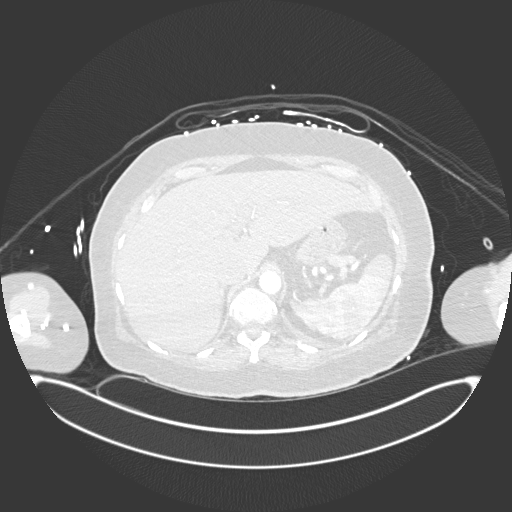
[im 65/323  mediastinal]
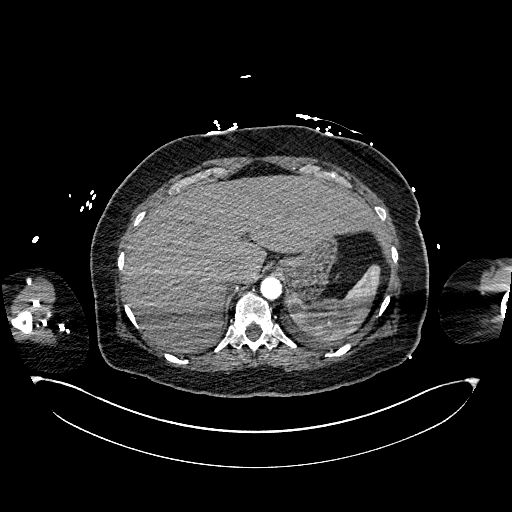
[im 97/323  lung]
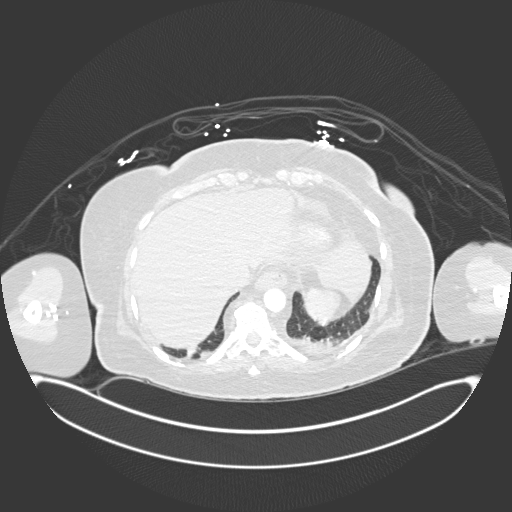
[im 113/323  mediastinal]
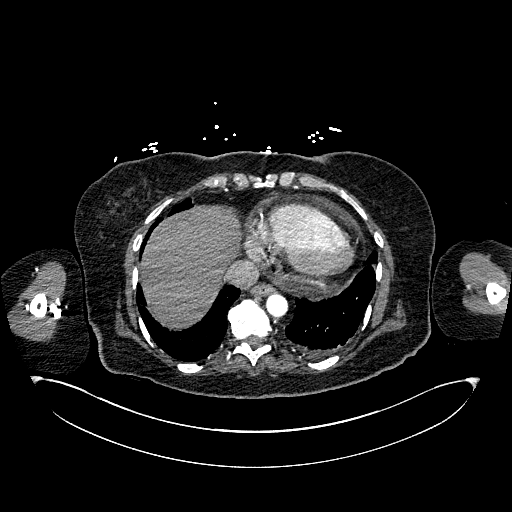
[im 129/323  lung]
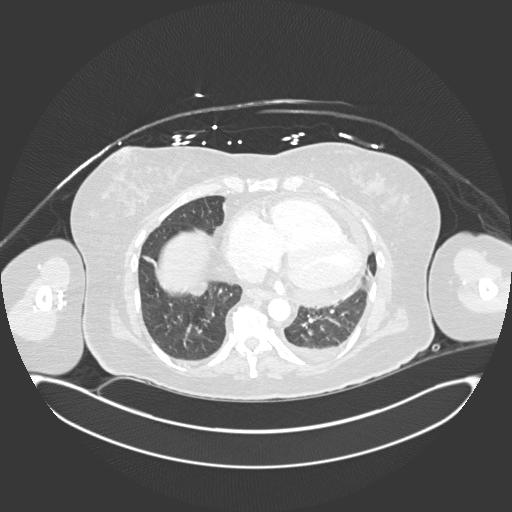
[im 145/323  mediastinal]
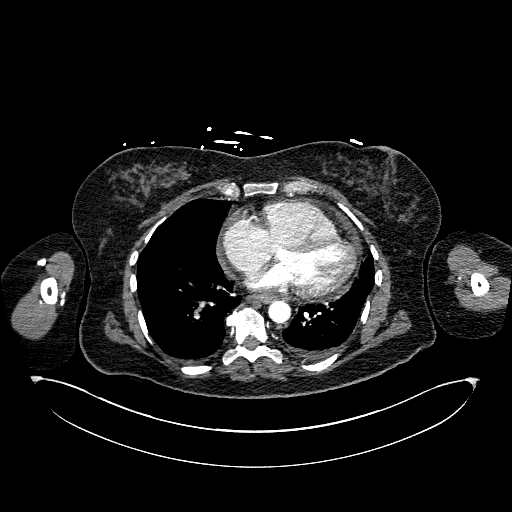
[im 162/323  lung]
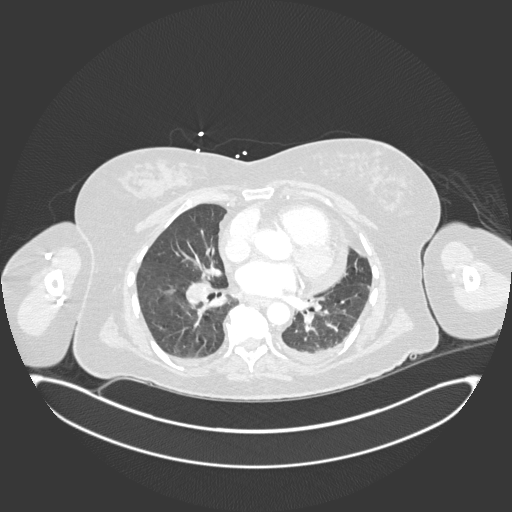
[im 178/323  mediastinal]
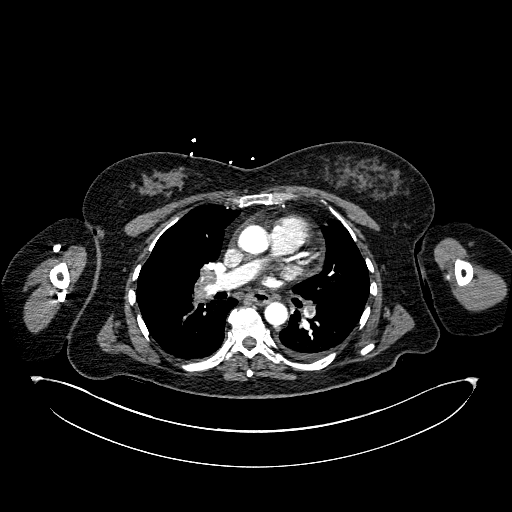
[im 194/323  lung]
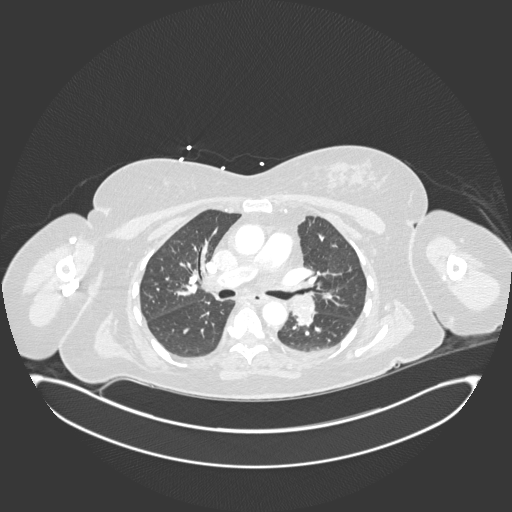
[im 210/323  mediastinal]
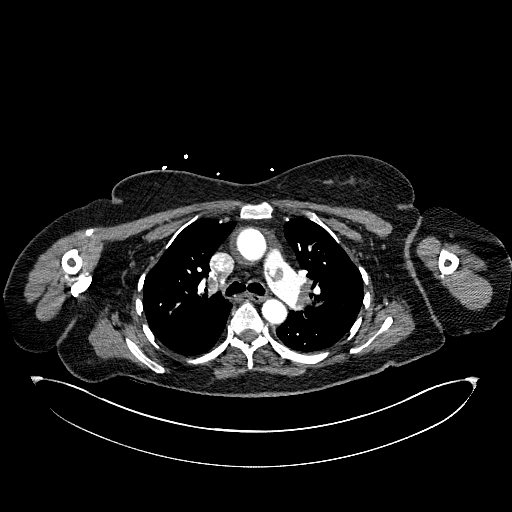
[im 226/323  lung]
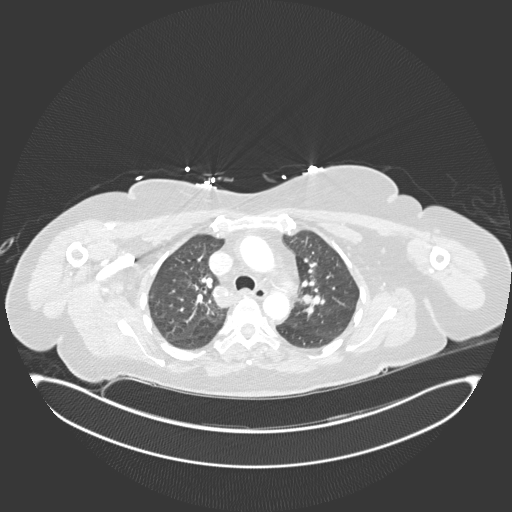
[im 258/323  mediastinal]
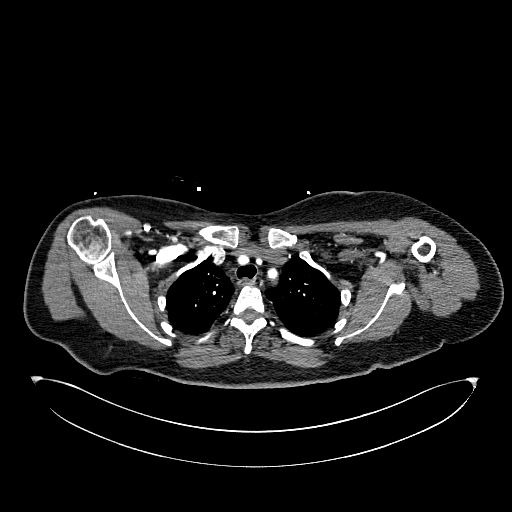
[im 274/323  lung]
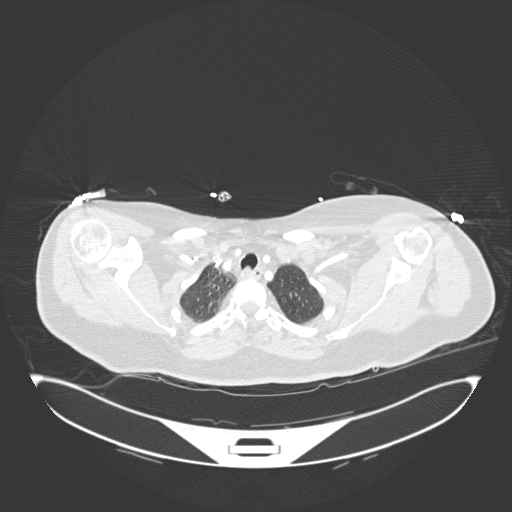
[im 290/323  mediastinal]
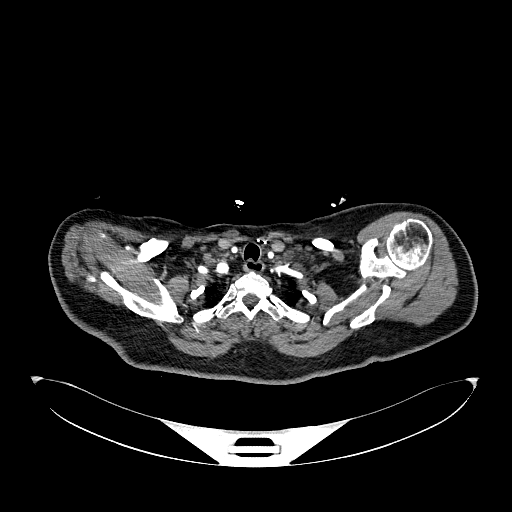
[im 306/323  lung]
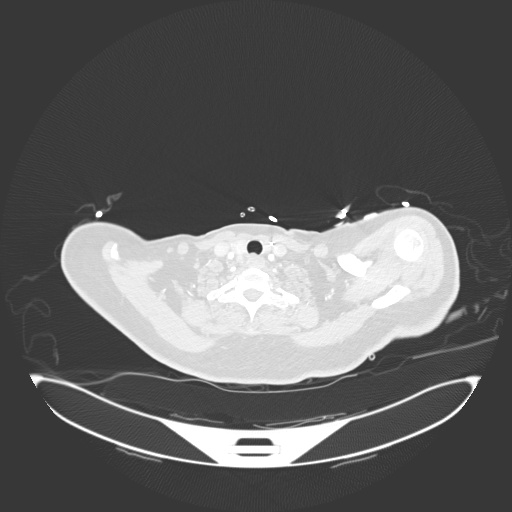

[Series 8: coronal mpr · coronal · 0.63mm/px · 1 of 151 slices shown]
[im 76/151  mediastinal]
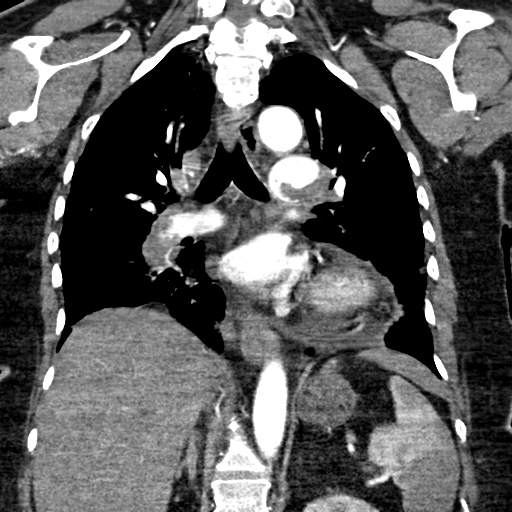

[18 of 36 positions shown; findings below may reference images not displayed]

FINDINGS: Cardiovascular: Good contrast bolus timing in the pulmonary arterial
tree. Positive pulmonary emboli including saddle embolus (series 8,
image 67). Thrombus in both main pulmonary arteries, and involving
all bilateral lobar branches.

Abnormal RV to LV ratio (series 6, image 185) compatible with right
heart strain. There is a small superimposed pericardial effusion.

Negative visible aorta aside from mild atherosclerosis. Faint
calcified coronary artery atherosclerosis (series 6, image 147).

Mediastinum/Nodes: Negative.  No lymphadenopathy.

Lungs/Pleura: Trace bilateral layering pleural effusions. Major
airways are patent. Mild dependent and perihilar atelectasis. No
confluent pulmonary infarct at this time.

Upper Abdomen: Negative visible liver, gallbladder, pancreas,
adrenal glands, left kidney and bowel. Heterogeneous enhancement of
the spleen which can be seen with early contrast timing due to red
pulp/white pulp perfusion differences. However, the confluent
hypodensity seen on series 5, image 101 is atypical. Still, there is
no perisplenic inflammation or fluid evident.

Musculoskeletal: No acute or suspicious osseous lesion.

Review of the MIP images confirms the above findings.
IMPRESSION: 1. Positive for Acute PE with saddle embolus, a large clot burden,
and CT evidence of right heart strain.

2. Small pericardial effusion and pleural effusions. No pulmonary
infarct at this time.

3. Heterogeneous enhancement of the spleen, somewhat atypical for
the physiologic early splenic enhancement. However, the absence of
perisplenic inflammation or fluid argues against acute splenic
infarcts.

Critical Value/emergent results were called by telephone at the time
of interpretation on 09/12/2019 at [DATE] to PA Yoga Gayosso who
verbally acknowledged these results.

## 2022-05-20 ENCOUNTER — Encounter (INDEPENDENT_AMBULATORY_CARE_PROVIDER_SITE_OTHER): Payer: Medicare HMO | Admitting: Family Medicine

## 2022-05-25 DIAGNOSIS — Z0289 Encounter for other administrative examinations: Secondary | ICD-10-CM

## 2022-05-28 ENCOUNTER — Ambulatory Visit (INDEPENDENT_AMBULATORY_CARE_PROVIDER_SITE_OTHER): Payer: Medicare HMO | Admitting: Family Medicine

## 2022-05-28 ENCOUNTER — Encounter (INDEPENDENT_AMBULATORY_CARE_PROVIDER_SITE_OTHER): Payer: Self-pay | Admitting: Family Medicine

## 2022-05-28 VITALS — BP 107/67 | HR 73 | Temp 98.0°F | Ht 66.0 in | Wt 226.0 lb

## 2022-05-28 DIAGNOSIS — R739 Hyperglycemia, unspecified: Secondary | ICD-10-CM | POA: Diagnosis not present

## 2022-05-28 DIAGNOSIS — R0602 Shortness of breath: Secondary | ICD-10-CM | POA: Diagnosis not present

## 2022-05-28 DIAGNOSIS — R5383 Other fatigue: Secondary | ICD-10-CM

## 2022-05-28 DIAGNOSIS — F32A Depression, unspecified: Secondary | ICD-10-CM

## 2022-05-28 DIAGNOSIS — E7800 Pure hypercholesterolemia, unspecified: Secondary | ICD-10-CM | POA: Diagnosis not present

## 2022-05-28 DIAGNOSIS — E039 Hypothyroidism, unspecified: Secondary | ICD-10-CM | POA: Diagnosis not present

## 2022-05-28 DIAGNOSIS — N3941 Urge incontinence: Secondary | ICD-10-CM

## 2022-05-28 DIAGNOSIS — E559 Vitamin D deficiency, unspecified: Secondary | ICD-10-CM

## 2022-05-28 DIAGNOSIS — F419 Anxiety disorder, unspecified: Secondary | ICD-10-CM

## 2022-05-28 DIAGNOSIS — E668 Other obesity: Secondary | ICD-10-CM

## 2022-05-28 DIAGNOSIS — Z6836 Body mass index (BMI) 36.0-36.9, adult: Secondary | ICD-10-CM

## 2022-05-28 NOTE — Progress Notes (Signed)
Chief Complaint:   OBESITY Julia Gomez (MR# [id number]) is a 72 y.o. female who presents for evaluation and treatment of obesity and related comorbidities. Current BMI is Body mass index is 36.48 kg/m. Julia Gomez has been struggling with her weight for many years and has been unsuccessful in either losing weight, maintaining weight loss, or reaching her healthy weight goal.  Julia Gomez is currently in the action stage of change and ready to dedicate time achieving and maintaining a healthier weight. Julia Gomez is interested in becoming our patient and working on intensive lifestyle modifications including (but not limited to) diet and exercise for weight loss.  Patient lives at home with her husband Julia Gomez who is supportive but does not eat with him.  She found this clinic through a google search.  Her husband is on a individual meal service that sent him an oven and fresh food weekly and the oven instructs you how to cook it.  She suffered a fall in 2021 and sustained a significant hematoma on the back of her head and a submassive PE that required IR intervention due to hypoxia and right heart strain.  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  She thinks that this caused weight gain as she was much more sedentary.  She tends to crave sweets and soda.  She has been trying to cut down to low calorie sweets like lorna dunes or belvita bars.  Wakes up at 4am and takes her medication.  She eats a belvita breakfast bar with her medication.  Then around 9am she eats cottage cheese (individual containers with fruit).  Lunch varies sometimes she eats parfaits- fruit, granola and yogurt (store made) or snack packs of vegetables with ranch (about 160 calories total).  For supper she eats salad with maybe chicken or ham.    Julia Gomez's habits were reviewed today and are as follows: her desired weight loss is 26+ lbs, she started gaining weight at 71 years old, her heaviest  weight ever was 236 pounds, she has significant food cravings issues, she is frequently drinking liquids with calories, and she frequently makes poor food choices.  Depression Screen Julia Gomez's Food and Mood (modified PHQ-9) score was 1.  Subjective:   1. Other fatigue Julia Gomez admits to daytime somnolence and denies waking up still tired. Patient has a history of symptoms of daytime fatigue. Julia Gomez generally gets 7 hours of sleep per night, and states that she has generally restful sleep. Snoring is not present. Apneic episodes are not present. Epworth Sleepiness Score is 16.  EKG normal sinus rhythm at 75 bpm.  2. SOBOE (shortness of breath on exertion) Julia Gomez notes increasing shortness of breath with exercising and seems to be worsening over time with weight gain. She notes getting out of breath sooner with activity than she used to. This has not gotten worse recently. Julia Gomez denies shortness of breath at rest or orthopnea.  3. Hyperglycemia Julia Gomez recent A1c was 5.9.  Her blood sugar was elevated at the end of April.  She is not on medications.  4. Pure hypercholesterolemia Julia Gomez's LDL was elevated in mid December 2023.  She is on atorvastatin.  5. Vitamin D deficiency Julia Gomez's last labs were from 2020.  She is on 5000 units daily, and she notes fatigue.  6. Hypothyroidism, unspecified type Julia Gomez's last TSH was 0.876.  She is on levothyroxine 75 mcg.  7. Urge incontinence Julia Gomez recently got Botox previously to help, but has  unfortunately also suffered from UTL will.  8. Anxiety and depression Julia Gomez sees [facility] at [facility].  She denies suicidal or homicidal ideations.  She is on Abilify, Prozac, and Vistaril.  Patient voices that she has been on these medications for years.  Assessment/Plan:   1. Other fatigue Julia Gomez does feel that her weight is causing her energy to be lower than it should be. Fatigue may be related to obesity, depression or many other  causes. Labs will be ordered, and in the meanwhile, Julia Gomez will focus on self care including making healthy food choices, increasing physical activity and focusing on stress reduction.  - EKG 12-Lead  2. SOBOE (shortness of breath on exertion) Julia Gomez does feel that she gets out of breath more easily that she used to when she exercises. Julia Gomez's shortness of breath appears to be obesity related and exercise induced. She has agreed to work on weight loss and gradually increase exercise to treat her exercise induced shortness of breath. Will continue to monitor closely.  3. Hyperglycemia We will check labs today, and we will follow-up at [facility] next visit.  - Comprehensive metabolic panel - Insulin, random  4. Pure hypercholesterolemia We will check labs today, and we will follow-up at [facility] next visit.  - Lipid Panel With LDL/HDL Ratio  5. Vitamin D deficiency We will check labs today, and we will follow-up at [facility] next visit.  - VITAMIN D 25 Hydroxy (Vit-D Deficiency, Fractures)  6. Hypothyroidism, unspecified type We will check labs today, and we will follow-up at [facility] next visit.  - T3 - T4, free  7. Urge incontinence We will follow-up on her plan at her next appointment (patient is seeing a new urologist today).  8. Anxiety and depression Julia Gomez will continue her current medications.  9. BMI 36.0-36.9,adult  10. Class 2 severe obesity with serious comorbidity and body mass index (BMI) of 36.0 to 36.9 in adult, unspecified obesity type Julia Gomez) Julia Gomez is currently in the action stage of change and her goal is to continue with weight loss efforts. I recommend Julia Gomez begin the structured treatment plan as follows:  She has agreed to the Category 2 Plan.  Lunch options handout was given.  Exercise goals: No exercise has been prescribed at this time.   Behavioral modification strategies: increasing lean protein intake, meal planning and cooking  strategies, keeping healthy foods in the home, and planning for success.  She was informed of the importance of frequent follow-up visits to maximize her success with intensive lifestyle modifications for her multiple health conditions. She was informed we would discuss her lab results at her next visit unless there is a critical issue that needs to be addressed sooner. Ganiya agreed to keep her next visit at the agreed upon time to discuss these results.  Objective:   Blood pressure 107/67, pulse 73, temperature 98 F (36.7 C), height 5\' 6"  (1.676 m), weight 226 lb (102.5 kg), SpO2 95 %. Body mass index is 36.48 kg/m.  EKG: Normal sinus rhythm, rate 75 BPM.  Indirect Calorimeter completed today shows a VO2 of 208 and a REE of 1440.  Her calculated basal metabolic rate is 1610 thus her basal metabolic rate is worse than expected.  General: Cooperative, alert, well developed, in no acute distress. HEENT: Conjunctivae and lids unremarkable. Cardiovascular: Regular rhythm.  Lungs: Normal work of breathing. Neurologic: No focal deficits.   Lab Results  Component Value Date   CREATININE 1.17 (H) 05/28/2022   BUN 12 05/28/2022  NA 141 05/28/2022   K 4.7 05/28/2022   CL 103 05/28/2022   CO2 22 05/28/2022   Lab Results  Component Value Date   ALT 18 05/28/2022   AST 18 05/28/2022   ALKPHOS 179 (H) 05/28/2022   BILITOT <0.2 05/28/2022   Lab Results  Component Value Date   HGBA1C 6.1 (H) 09/12/2019   Lab Results  Component Value Date   INSULIN 5.8 05/28/2022   No results found for: "TSH" Lab Results  Component Value Date   CHOL 162 05/28/2022   HDL 57 05/28/2022   LDLCALC 82 05/28/2022   TRIG 129 05/28/2022   Lab Results  Component Value Date   WBC 8.9 09/19/2019   HGB 9.6 (L) 09/19/2019   HCT 31.1 (L) 09/19/2019   MCV 95.7 09/19/2019   PLT 277 09/19/2019   Lab Results  Component Value Date   FERRITIN 270 09/12/2019   Attestation Statements:   Reviewed by  clinician on day of visit: allergies, medications, problem list, medical history, surgical history, family history, social history, and previous encounter notes.  Time spent on visit including pre-visit chart review and post-visit charting and care was 40 minutes.   I, Burt Knack, am acting as transcriptionist for Reuben Likes, MD.  This is the patient's first visit at [facility]. The patient's NEW PATIENT PACKET was reviewed at length. Included in the packet: current and past health history, medications, allergies, ROS, gynecologic history (women only), surgical history, family history, social history, weight history, weight loss surgery history (for those that have had weight loss surgery), nutritional evaluation, mood and food questionnaire, PHQ9, Epworth questionnaire, sleep habits questionnaire, patient life and health improvement goals questionnaire. These will all be scanned into the patient's chart under media.   During the visit, I independently reviewed the patient's EKG, bioimpedance scale results, and indirect calorimeter results. I used this information to tailor a meal plan for the patient that will help her to lose weight and will improve her obesity-related conditions going forward. I performed a medically necessary appropriate examination and/or evaluation. I discussed the assessment and treatment plan with the patient. The patient was provided an opportunity to ask questions and all were answered. The patient agreed with the plan and demonstrated an understanding of the instructions. Labs were ordered at this visit and will be reviewed at the next visit unless more critical results need to be addressed immediately. Clinical information was updated and documented in the EMR.    I have reviewed the above documentation for accuracy and completeness, and I agree with the above. - Reuben Likes, MD

## 2022-05-29 LAB — LIPID PANEL WITH LDL/HDL RATIO
Cholesterol, Total: 162 mg/dL (ref 100–199)
HDL: 57 mg/dL (ref >39)
LDL Chol Calc (NIH): 82 mg/dL (ref 0–99)
LDL/HDL Ratio: 1.4 ratio (ref 0.0–3.2)
Triglycerides: 129 mg/dL (ref 0–149)
VLDL Cholesterol Cal: 23 mg/dL (ref 5–40)

## 2022-05-29 LAB — COMPREHENSIVE METABOLIC PANEL
ALT: 18 IU/L (ref 0–32)
AST: 18 IU/L (ref 0–40)
Albumin/Globulin Ratio: 1.4 (ref 1.2–2.2)
Albumin: 4.1 g/dL (ref 3.9–4.9)
Alkaline Phosphatase: 179 IU/L — ABNORMAL HIGH (ref 44–121)
BUN/Creatinine Ratio: 10 — ABNORMAL LOW (ref 12–28)
BUN: 12 mg/dL (ref 8–27)
Bilirubin Total: <0.2 mg/dL (ref 0.0–1.2)
CO2: 22 mmol/L (ref 20–29)
Calcium: 8.8 mg/dL (ref 8.7–10.3)
Chloride: 103 mmol/L (ref 96–106)
Creatinine, Ser: 1.17 mg/dL — ABNORMAL HIGH (ref 0.57–1.00)
Globulin, Total: 3 g/dL (ref 1.5–4.5)
Glucose: 107 mg/dL — ABNORMAL HIGH (ref 70–99)
Potassium: 4.7 mmol/L (ref 3.5–5.2)
Sodium: 141 mmol/L (ref 134–144)
Total Protein: 7.1 g/dL (ref 6.0–8.5)
eGFR: 50 mL/min/{1.73_m2} — ABNORMAL LOW (ref >59)

## 2022-05-29 LAB — INSULIN, RANDOM: INSULIN: 5.8 u[IU]/mL (ref 2.6–24.9)

## 2022-05-29 LAB — T3: T3, Total: 136 ng/dL (ref 71–180)

## 2022-05-29 LAB — T4, FREE: Free T4: 0.95 ng/dL (ref 0.82–1.77)

## 2022-05-29 LAB — VITAMIN D 25 HYDROXY (VIT D DEFICIENCY, FRACTURES): Vit D, 25-Hydroxy: 69.1 ng/mL (ref 30.0–100.0)

## 2022-06-11 ENCOUNTER — Encounter (INDEPENDENT_AMBULATORY_CARE_PROVIDER_SITE_OTHER): Payer: Self-pay | Admitting: Family Medicine

## 2022-06-11 ENCOUNTER — Ambulatory Visit (INDEPENDENT_AMBULATORY_CARE_PROVIDER_SITE_OTHER): Payer: Medicare HMO | Admitting: Family Medicine

## 2022-06-11 VITALS — BP 131/79 | HR 105 | Temp 99.0°F | Ht 66.0 in | Wt 228.0 lb

## 2022-06-11 DIAGNOSIS — R7303 Prediabetes: Secondary | ICD-10-CM | POA: Diagnosis not present

## 2022-06-11 DIAGNOSIS — E559 Vitamin D deficiency, unspecified: Secondary | ICD-10-CM | POA: Diagnosis not present

## 2022-06-11 DIAGNOSIS — E7849 Other hyperlipidemia: Secondary | ICD-10-CM | POA: Diagnosis not present

## 2022-06-11 DIAGNOSIS — E669 Obesity, unspecified: Secondary | ICD-10-CM | POA: Diagnosis not present

## 2022-06-11 DIAGNOSIS — Z6836 Body mass index (BMI) 36.0-36.9, adult: Secondary | ICD-10-CM

## 2022-06-11 MED ORDER — METFORMIN HCL 500 MG PO TABS
500.0000 mg | ORAL_TABLET | Freq: Every day | ORAL | 0 refills | Status: DC
Start: 2022-06-11 — End: 2022-09-14

## 2022-06-11 NOTE — Progress Notes (Signed)
Chief Complaint:   OBESITY Julia Gomez is here to discuss her progress with her obesity treatment plan along with follow-up of her obesity related diagnoses. Julia Gomez is on the Category 2 Plan and states she is following her eating plan approximately 75-90% of the time. Julia Gomez states she is doing exercises.   Today's visit was #: 2 Starting weight: 226 lbs Starting date: 05/28/2022 Today's weight: 228 lbs Today's date: 06/11/2022 Total lbs lost to date: 0 Total lbs lost since last in-office visit: 0  Interim History: Patient found foods she really enjoyed on the plan and elaborating on what is on the plan to be more realistic for herself. She is waking up oat 3:30/4am and she is ravenous. She has been eating [employer] breakfast bars and those are around 230/240 calories. She has done more string cheese.   Subjective:   1. Prediabetes Patient's last A1c was 5.9 and insulin 5.8.  She is not on medications.  2. Vitamin D deficiency Patient is on 5000 IU daily OTC vitamin D.  She notes fatigue.  Last vitamin D level was of 69.1.  3. Other hyperlipidemia Patient's last LDL was 82, HDL 57, and triglycerides 161.  She is on Lipitor.  Assessment/Plan:   1. Prediabetes Patient agreed to start metformin 500 mg daily with no refills.  - metFORMIN (GLUCOPHAGE) 500 MG tablet; Take 1 tablet (500 mg total) by mouth daily with breakfast.  Dispense: 30 tablet; Refill: 0  2. Vitamin D deficiency Patient's vitamin D level is at goal, and she is to continue vitamin D with no change in OTC.  3. Other hyperlipidemia Patient will continue Lipitor with no change in dose.  4. BMI 36.0-36.9,adult  5. Obesity with starting BMI of 36.4 Julia Gomez is currently in the action stage of change. As such, her goal is to continue with weight loss efforts. She has agreed to keeping a food journal and adhering to recommended goals of 1150-1250 calories and 80+ grams of protein daily.   Exercise goals: No exercise  has been prescribed at this time.  Behavioral modification strategies: increasing lean protein intake, meal planning and cooking strategies, keeping healthy foods in the home, and planning for success.  Julia Gomez has agreed to follow-up with our clinic in 2 weeks. She was informed of the importance of frequent follow-up visits to maximize her success with intensive lifestyle modifications for her multiple health conditions.   Objective:   Blood pressure 131/79, pulse (!) 105, temperature 99 F (37.2 C), height 5\' 6"  (1.676 m), weight 228 lb (103.4 kg), SpO2 95 %. Body mass index is 36.8 kg/m.  General: Cooperative, alert, well developed, in no acute distress. HEENT: Conjunctivae and lids unremarkable. Cardiovascular: Regular rhythm.  Lungs: Normal work of breathing. Neurologic: No focal deficits.   Lab Results  Component Value Date   CREATININE 1.17 (H) 05/28/2022   BUN 12 05/28/2022   NA 141 05/28/2022   K 4.7 05/28/2022   CL 103 05/28/2022   CO2 22 05/28/2022   Lab Results  Component Value Date   ALT 18 05/28/2022   AST 18 05/28/2022   ALKPHOS 179 (H) 05/28/2022   BILITOT <0.2 05/28/2022   Lab Results  Component Value Date   HGBA1C 6.1 (H) 09/12/2019   Lab Results  Component Value Date   INSULIN 5.8 05/28/2022   No results found for: "TSH" Lab Results  Component Value Date   CHOL 162 05/28/2022   HDL 57 05/28/2022   LDLCALC 82 05/28/2022  TRIG 129 05/28/2022   Lab Results  Component Value Date   VD25OH 69.1 05/28/2022   Lab Results  Component Value Date   WBC 8.9 09/19/2019   HGB 9.6 (L) 09/19/2019   HCT 31.1 (L) 09/19/2019   MCV 95.7 09/19/2019   PLT 277 09/19/2019   Lab Results  Component Value Date   FERRITIN 270 09/12/2019   Attestation Statements:   Reviewed by clinician on day of visit: allergies, medications, problem list, medical history, surgical history, family history, social history, and previous encounter notes.  Time spent on visit  including pre-visit chart review and post-visit care and charting was 45 minutes.   I, Burt Knack, am acting as transcriptionist for Reuben Likes, MD.  I have reviewed the above documentation for accuracy and completeness, and I agree with the above. - Reuben Likes, MD

## 2022-06-25 ENCOUNTER — Ambulatory Visit (INDEPENDENT_AMBULATORY_CARE_PROVIDER_SITE_OTHER): Payer: Medicare HMO | Admitting: Physician Assistant

## 2022-06-25 ENCOUNTER — Encounter (INDEPENDENT_AMBULATORY_CARE_PROVIDER_SITE_OTHER): Payer: Self-pay | Admitting: Physician Assistant

## 2022-06-25 VITALS — BP 133/82 | HR 106 | Temp 98.3°F | Ht 66.0 in | Wt 225.0 lb

## 2022-06-25 DIAGNOSIS — E559 Vitamin D deficiency, unspecified: Secondary | ICD-10-CM

## 2022-06-25 DIAGNOSIS — E669 Obesity, unspecified: Secondary | ICD-10-CM | POA: Diagnosis not present

## 2022-06-25 DIAGNOSIS — Z6836 Body mass index (BMI) 36.0-36.9, adult: Secondary | ICD-10-CM | POA: Diagnosis not present

## 2022-06-25 DIAGNOSIS — R7303 Prediabetes: Secondary | ICD-10-CM | POA: Diagnosis not present

## 2022-06-25 NOTE — Progress Notes (Unsigned)
.smr  Office: [phone]  /  Fax: [phone]  WEIGHT SUMMARY AND BIOMETRICS  Vitals Temp: 98.3 F ( 36.8 C )  BP: 133/82 Pulse Rate: 82 SpO2: 98 %    Anthropometric Measurements Height: 5\' 6"  (1.676 m) Weight: 225 lb (102.1 kg)  BMI (Calculated): 36.33 Weight at Last Visit: 228 lb Weight Lost Since Last Visit: 3 lb Starting Weight: 226 lb  Body Composition  Body Fat %: 48.5 % Fat Mass (lbs): 109.4 lbs Muscle Mass (lbs): 110.4 lbs Total Body Water (lbs): 86.4 lbs Visceral Fat Rating : 15   Other Clinical Data Fasting: No Labs: No Today's Visit #: 3 Starting Date: 05/28/2022  HPI  Chief Complaint: OBESITY  Julia Gomez is here to discuss her progress with her obesity treatment plan. She is on the keeping a food journal and adhering to recommended goals of 1150-1250 calories and 80+ grams of protein and states she is following her eating plan approximately 80-90 % of the time. She states she is exercising/PT 45 minutes 1-2 times per week.   Interval History:  Since last office visit she down 3 lbs.   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 Doing home exercises and Cardio-drumming which she enjoys. Has stationary bike Hydration-Needs to increase water intake   Pharmacotherapy: metformin for primary indication of prediabetes. No GI side effects with metformin  TREATMENT PLAN FOR OBESITY:  Recommended Dietary Goals  Julia Gomez is currently in the action stage of change. As such, her goal is to continue weight management plan. She has agreed to keeping a food journal and adhering to recommended goals of 1150-1250 calories and 80 grams protein.  Behavioral Intervention  We discussed the following Behavioral Modification Strategies today: increasing lean protein intake, decreasing simple carbohydrates ,  increasing vegetables, increasing lower glycemic fruits, increasing fiber rich foods, increasing water intake, work on tracking and journaling calories using tracking application, continue to practice mindfulness when eating, planning for success, and better snacking choices.  Additional resources provided today: NA  Recommended Physical Activity Goals  Julia Gomez has been advised to work up to 150 minutes of moderate intensity aerobic activity a week and strengthening exercises 2-3 times per week for cardiovascular health, weight loss maintenance and preservation of muscle mass.   She has agreed to Continue current level of physical activity    Pharmacotherapy We discussed various medication options to help Julia Gomez with her weight loss efforts and we both agreed to continue metformin for prediabetes.    Return in about 2 weeks (around 07/09/2022).Marland Kitchen She was informed of the importance of frequent follow up visits to maximize her success with intensive lifestyle modifications for her multiple health conditions.  PHYSICAL EXAM:  Blood pressure 133/82, pulse (!) 106, temperature 98.3 F (36.8 C), height 5\' 6"  (1.676 m), weight 225 lb (102.1 kg), SpO2 98 %. Body mass index is 36.32 kg/m.  General: She is overweight, cooperative, alert, well developed, and in no acute distress. PSYCH: Has normal mood, affect and thought process.   Cardiovascular: HR slightly tachy regular, BP 133/82 Lungs: Normal breathing effort, no conversational dyspnea.  DIAGNOSTIC DATA REVIEWED:  BMET    Component Value Date/Time   NA 141 05/28/2022 1333   K 4.7 05/28/2022 1333   CL 103 05/28/2022 1333   CO2 22 05/28/2022 1333   GLUCOSE 107 (H) 05/28/2022 1333   GLUCOSE 106 (H) 09/19/2019 0725  BUN 12 05/28/2022 1333   CREATININE 1.17 (H) 05/28/2022 1333   CALCIUM 8.8 05/28/2022 1333   GFRNONAA >60 09/19/2019 0725   GFRAA >60 09/19/2019 0725   Lab Results  Component Value Date   HGBA1C 6.1 (H) 09/12/2019    Lab Results  Component Value Date   INSULIN 5.8 05/28/2022   No results found for: "TSH" CBC    Component Value Date/Time   WBC 8.9 09/19/2019 0725   RBC 3.25 (L) 09/19/2019 0725   HGB 9.6 (L) 09/19/2019 0725   HCT 31.1 (L) 09/19/2019 0725   PLT 277 09/19/2019 0725   MCV 95.7 09/19/2019 0725   MCH 29.5 09/19/2019 0725   MCHC 30.9 09/19/2019 0725   RDW 16.6 (H) 09/19/2019 0725   Iron Studies    Component Value Date/Time   FERRITIN 270 09/12/2019 1400   Lipid Panel     Component Value Date/Time   CHOL 162 05/28/2022 1333   TRIG 129 05/28/2022 1333   HDL 57 05/28/2022 1333   LDLCALC 82 05/28/2022 1333   Hepatic Function Panel     Component Value Date/Time   PROT 7.1 05/28/2022 1333   ALBUMIN 4.1 05/28/2022 1333   AST 18 05/28/2022 1333   ALT 18 05/28/2022 1333   ALKPHOS 179 (H) 05/28/2022 1333   BILITOT <0.2 05/28/2022 1333   No results found for: "TSH" Nutritional Lab Results  Component Value Date   VD25OH 69.1 05/28/2022    ASSOCIATED CONDITIONS ADDRESSED TODAY  ASSESSMENT AND PLAN  Problem List Items Addressed This Visit   None Visit Diagnoses     Prediabetes    -  Primary   Vitamin D deficiency       Obesity with starting BMI of 36.4         Prediabetes Last A1c was 5.9/ Insulin 5.8-   Medication(s): Metformin 500 mg once daily breakfast No Gi side effects with metformin.  Polyphagia:No Reports waking up very hungry. Discussed 100 calorie protein snacks and hand out provided.  Lab Results  Component Value Date   HGBA1C 6.1 (H) 09/12/2019   Lab Results  Component Value Date   INSULIN 5.8 05/28/2022    Plan: Continue Metformin 500 mg once daily breakfast Try 100 calorie protein snack near bedtime to help with early am hunger. Could also try having timing of metformin dosing and monitor response.  Continue working on nutrition plan to decrease simple carbohydrates, increase lean proteins and exercise to promote weight loss, improve  glycemic control and prevent progression to Type 2 diabetes.   Vitamin D Deficiency Vitamin D is at goal of 50.  Most recent vitamin D level was 69.1. She is on OTC vitamin D3 5000 IU daily. Lab Results  Component Value Date   VD25OH 69.1 05/28/2022    Plan: Continue OTC vitamin D3 5000 IU daily Low vitamin D levels can be associated with adiposity and may result in leptin resistance and weight gain. Also associated with fatigue. Currently on vitamin D supplementation without any adverse effects.  Recheck vitamin D level every 3-4 months to optimize supplementation.    ATTESTASTION STATEMENTS:  Reviewed by clinician on day of visit: allergies, medications, problem list, medical history, surgical history, family history, social history, and previous encounter notes.   I have personally spent 30 minutes total time today in preparation, patient care, nutritional counseling and documentation for this visit, including the following: review of clinical lab tests; review of medical tests/procedures/services.      Wilman Tucker, PA-C

## 2022-07-04 ENCOUNTER — Other Ambulatory Visit (INDEPENDENT_AMBULATORY_CARE_PROVIDER_SITE_OTHER): Payer: Self-pay | Admitting: Family Medicine

## 2022-07-04 DIAGNOSIS — R7303 Prediabetes: Secondary | ICD-10-CM

## 2022-07-08 ENCOUNTER — Other Ambulatory Visit (INDEPENDENT_AMBULATORY_CARE_PROVIDER_SITE_OTHER): Payer: Self-pay | Admitting: Family Medicine

## 2022-07-08 DIAGNOSIS — R7303 Prediabetes: Secondary | ICD-10-CM

## 2022-07-13 ENCOUNTER — Ambulatory Visit (INDEPENDENT_AMBULATORY_CARE_PROVIDER_SITE_OTHER): Payer: PRIVATE HEALTH INSURANCE | Admitting: Family Medicine

## 2022-07-27 ENCOUNTER — Ambulatory Visit (INDEPENDENT_AMBULATORY_CARE_PROVIDER_SITE_OTHER): Payer: PRIVATE HEALTH INSURANCE | Admitting: Physician Assistant

## 2022-09-13 NOTE — Progress Notes (Unsigned)
.smr  Office: [phone]  /  Fax: [phone]  WEIGHT SUMMARY AND BIOMETRICS  Vitals Temp: 98.2 F (36.8 C) BP: 129/76 Pulse Rate: 97 SpO2: 95 %   Anthropometric Measurements Height: 5\' 6"  (1.676 m) Weight: 226 lb (102.5 kg) BMI (Calculated): 36.49 Weight at Last Visit: 225 lb Weight Lost Since Last Visit: 0 Weight Gained Since Last Visit: 1 lb Starting Weight: 226 lb Total Weight Loss (lbs): 1 lb (0.454 kg) Peak Weight: 235 lb   Body Composition  Body Fat %: 50.6 % Fat Mass (lbs): 114.8 lbs Muscle Mass (lbs): 106.4 lbs Visceral Fat Rating : 16   Other Clinical Data Fasting: no Labs: no Today's Visit #: 4 Starting Date: 05/28/22     HPI  Chief Complaint: OBESITY  Marianthi is here to discuss her progress with her obesity treatment plan. She is on the keeping a food journal and adhering to recommended goals of 1150-1250 calories and 85+ grams of protein and states she is following her eating plan approximately 70 % of the time. She states she is exercising Physical therapy 45 minutes 1 times per week.   Interval History:  Since last office visit she is up 1 lb. Last in office visit 06/25/2022.   She had surgery on her left foot 07/21/22 and is continuing to heal following surgery.  She has been trying to focus on getting adequate protein and limiting simple carbohydrates. Her husband has been doing cooking. They have been using [employer] meal service and also [employer] meal delivery with smart oven service which has helped some. She does report some fast food meals like McDonalds breakfast- pancakes and we discussed better options like high protein pancake products . She is supplementing protein with Premier shakes.  Hunger/appetite-Moderate control . Mostly satisfied. Wakes up very early and eats breakfast. Reports this is the only time she feels very hungry.  Cravings- denies excessive cravings Stress- increased with recent surgery, but manageable overall.  Sleep-  not restorative since menopause. Follow up with PCP.  Exercise-Limited by recent foot surgery. Remains in a post op shoe.  Hydration-adequate  PCP- Dr. Abran Duke at [facility] in [place]   Pharmacotherapy: metformin for prediabetes. Reports no GI side effects or other side effects.   TREATMENT PLAN FOR OBESITY:  Recommended Dietary Goals  Quetcy is currently in the action stage of change. As such, her goal is to continue weight management plan. She has agreed to keeping a food journal and adhering to recommended goals of 1150-1250 calories and 85+ grams of protein.  Behavioral Intervention  We discussed the following Behavioral Modification Strategies today: increasing lean protein intake, decreasing simple carbohydrates , increasing vegetables, increasing lower glycemic fruits, increasing water intake, work on meal planning and preparation, work on Counselling psychologist calories using tracking application, emotional eating strategies and understanding the difference between hunger signals and cravings, continue to practice mindfulness when eating, and planning for success.  Additional resources provided today: NA  Recommended Physical Activity Goals  Ronetta has been advised to work up to 150 minutes of moderate intensity aerobic activity a week and strengthening exercises 2-3 times per week for cardiovascular health, weight loss maintenance and preservation of muscle mass.   She has agreed to Continue current level of physical activity  and Think about ways to increase daily physical activity and overcoming barriers to exercise   Pharmacotherapy We discussed various medication options to help Christee with her weight loss efforts and we both agreed to continue metformin for primary indication  of prediabetes.    Return in about 3 weeks (around 10/05/2022).Marland Kitchen She was informed of the importance of frequent follow up visits to maximize her success with intensive lifestyle  modifications for her multiple health conditions.  PHYSICAL EXAM:  Blood pressure 129/76, pulse 97, temperature 98.2 F (36.8 C), height 5\' 6"  (1.676 m), weight 226 lb (102.5 kg), SpO2 95%. Body mass index is 36.48 kg/m.  General: She is overweight, cooperative, alert, well developed, and in no acute distress. PSYCH: Has normal mood, affect and thought process.   Cardiovascular: HR 90's , BP 129/76 Lungs: Normal breathing effort, no conversational dyspnea.   DIAGNOSTIC DATA REVIEWED:  BMET    Component Value Date/Time   NA 141 05/28/2022 1333   K 4.7 05/28/2022 1333   CL 103 05/28/2022 1333   CO2 22 05/28/2022 1333   GLUCOSE 107 (H) 05/28/2022 1333   GLUCOSE 106 (H) 09/19/2019 0725   BUN 12 05/28/2022 1333   CREATININE 1.17 (H) 05/28/2022 1333   CALCIUM 8.8 05/28/2022 1333   GFRNONAA >60 09/19/2019 0725   GFRAA >60 09/19/2019 0725   Lab Results  Component Value Date   HGBA1C 6.1 (H) 09/12/2019   Lab Results  Component Value Date   INSULIN 5.8 05/28/2022   No results found for: "TSH" CBC    Component Value Date/Time   WBC 8.9 09/19/2019 0725   RBC 3.25 (L) 09/19/2019 0725   HGB 9.6 (L) 09/19/2019 0725   HCT 31.1 (L) 09/19/2019 0725   PLT 277 09/19/2019 0725   MCV 95.7 09/19/2019 0725   MCH 29.5 09/19/2019 0725   MCHC 30.9 09/19/2019 0725   RDW 16.6 (H) 09/19/2019 0725   Iron Studies    Component Value Date/Time   FERRITIN 270 09/12/2019 1400   Lipid Panel     Component Value Date/Time   CHOL 162 05/28/2022 1333   TRIG 129 05/28/2022 1333   HDL 57 05/28/2022 1333   LDLCALC 82 05/28/2022 1333   Hepatic Function Panel     Component Value Date/Time   PROT 7.1 05/28/2022 1333   ALBUMIN 4.1 05/28/2022 1333   AST 18 05/28/2022 1333   ALT 18 05/28/2022 1333   ALKPHOS 179 (H) 05/28/2022 1333   BILITOT <0.2 05/28/2022 1333   No results found for: "TSH" Nutritional Lab Results  Component Value Date   VD25OH 69.1 05/28/2022    ASSOCIATED  CONDITIONS ADDRESSED TODAY  ASSESSMENT AND PLAN  Problem List Items Addressed This Visit     Prediabetes - Primary   Relevant Medications   metFORMIN (GLUCOPHAGE) 500 MG tablet   Other hyperlipidemia   Relevant Medications   aspirin EC 325 MG tablet   Vitamin D deficiency   Obesity with starting BMI of 36.4   Relevant Medications   metFORMIN (GLUCOPHAGE) 500 MG tablet   BMI 36.0-36.9,adult Current BMI 36.6   Prediabetes Last A1c was  5.9 at [facility] 05/19/22 Medication(s): Metformin 500 mg once daily breakfast No GI or other side effects with metformin.  Polyphagia:No She is working on nutrition plan to decrease simple carbohydrates, increase lean proteins and exercise to promote weight loss, improve glycemic control and prevent progression to Type 2 diabetes.   Lab Results  Component Value Date   HGBA1C 6.1 (H) 09/12/2019   Lab Results  Component Value Date   INSULIN 5.8 05/28/2022    Plan: Continue and refill Metformin 500 mg once daily breakfast Continue working on nutrition plan to decrease simple carbohydrates, increase lean proteins and exercise  to promote weight loss, improve glycemic control and prevent progression to Type 2 diabetes.  Recheck A 1 c at next visit.   Hyperlipidemia LDL is at goal. HDL 57- at goal. Trig 129 at goal.  Medication(s): Lipitor 20 mg daily. No side effects with Lipitor reported.  Cardiovascular risk factors: advanced age (older than 46 for men, 23 for women), dyslipidemia, obesity (BMI >= 30 kg/m2), and sedentary lifestyle  Lab Results  Component Value Date   CHOL 162 05/28/2022   HDL 57 05/28/2022   LDLCALC 82 05/28/2022   TRIG 129 05/28/2022   Lab Results  Component Value Date   ALT 18 05/28/2022   AST 18 05/28/2022   ALKPHOS 179 (H) 05/28/2022   BILITOT <0.2 05/28/2022   The ASCVD Risk score (Arnett DK, et al., 2019) failed to calculate for the following reasons:   Unable to determine if patient is Non-Hispanic African  American  Plan: Continue statin. Continue to work on nutrition plan -decreasing simple carbohydrates, increasing lean proteins, decreasing saturated fats and cholesterol , avoiding trans fats and exercise as able to promote weight loss, improve lipids and decrease cardiovascular risks.     Vitamin D Deficiency Vitamin D is at goal of 50.  Most recent vitamin D level was 6. She is on OTC vitamin D3 5000 IU daily. Lab Results  Component Value Date   VD25OH 69.1 05/28/2022    Plan: Continue OTC vitamin D3 5000 IU daily Low vitamin D levels can be associated with adiposity and may result in leptin resistance and weight gain. Also associated with fatigue. Currently on vitamin D supplementation without any adverse effects.  Recheck vitamin D level 3-4 times yearly to optimize supplementation.   ATTESTASTION STATEMENTS:  Reviewed by clinician on day of visit: allergies, medications, problem list, medical history, surgical history, family history, social history, and previous encounter notes.   I have personally spent 42 minutes total time today in preparation, patient care, nutritional counseling and documentation for this visit, including the following: review of clinical lab tests; review of medical tests/procedures/services.      Renzo Vincelette, PA-C

## 2022-09-14 ENCOUNTER — Ambulatory Visit (INDEPENDENT_AMBULATORY_CARE_PROVIDER_SITE_OTHER): Payer: Medicare HMO | Admitting: Physician Assistant

## 2022-09-14 ENCOUNTER — Encounter (INDEPENDENT_AMBULATORY_CARE_PROVIDER_SITE_OTHER): Payer: Self-pay | Admitting: Physician Assistant

## 2022-09-14 VITALS — BP 129/76 | HR 97 | Temp 98.2°F | Ht 66.0 in | Wt 226.0 lb

## 2022-09-14 DIAGNOSIS — Z6836 Body mass index (BMI) 36.0-36.9, adult: Secondary | ICD-10-CM

## 2022-09-14 DIAGNOSIS — E669 Obesity, unspecified: Secondary | ICD-10-CM

## 2022-09-14 DIAGNOSIS — E559 Vitamin D deficiency, unspecified: Secondary | ICD-10-CM | POA: Diagnosis not present

## 2022-09-14 DIAGNOSIS — Z6835 Body mass index (BMI) 35.0-35.9, adult: Secondary | ICD-10-CM | POA: Insufficient documentation

## 2022-09-14 DIAGNOSIS — E7849 Other hyperlipidemia: Secondary | ICD-10-CM

## 2022-09-14 DIAGNOSIS — R7303 Prediabetes: Secondary | ICD-10-CM | POA: Diagnosis not present

## 2022-09-14 MED ORDER — METFORMIN HCL 500 MG PO TABS
500.0000 mg | ORAL_TABLET | Freq: Every day | ORAL | 2 refills | Status: DC
Start: 2022-09-14 — End: 2022-10-13

## 2022-10-13 ENCOUNTER — Ambulatory Visit (INDEPENDENT_AMBULATORY_CARE_PROVIDER_SITE_OTHER): Payer: Medicare HMO | Admitting: Physician Assistant

## 2022-10-13 ENCOUNTER — Encounter (INDEPENDENT_AMBULATORY_CARE_PROVIDER_SITE_OTHER): Payer: Self-pay | Admitting: Physician Assistant

## 2022-10-13 VITALS — BP 132/82 | HR 83 | Temp 98.3°F | Ht 66.0 in | Wt 221.0 lb

## 2022-10-13 DIAGNOSIS — N3281 Overactive bladder: Secondary | ICD-10-CM | POA: Diagnosis not present

## 2022-10-13 DIAGNOSIS — E669 Obesity, unspecified: Secondary | ICD-10-CM

## 2022-10-13 DIAGNOSIS — E559 Vitamin D deficiency, unspecified: Secondary | ICD-10-CM

## 2022-10-13 DIAGNOSIS — R7303 Prediabetes: Secondary | ICD-10-CM | POA: Diagnosis not present

## 2022-10-13 DIAGNOSIS — Z6835 Body mass index (BMI) 35.0-35.9, adult: Secondary | ICD-10-CM

## 2022-10-13 MED ORDER — METFORMIN HCL 500 MG PO TABS: 500.0000 mg | ORAL_TABLET | Freq: Every day | ORAL | 0 refills | Status: DC

## 2022-10-13 NOTE — Progress Notes (Signed)
.sm[facility]  Office: [phone]  /  Fax: [phone]  WEIGHT SUMMARY AND BIOMETRICS  Vitals Temp: 98.3 F (36.8 C) BP: 132/82 Pulse Rate: 83 SpO2: 96 %   Anthropometric Measurements Height: 5\' 6"  (1.676 m) Weight: 221 lb (100.2 kg) BMI (Calculated): 35.69 Weight at Last Visit: 226 lb Weight Lost Since Last Visit: 5 lb Weight Gained Since Last Visit: 0 Starting Weight: 226 lb Total Weight Loss (lbs): 5 lb (2.268 kg) Peak Weight: 235 lb   Body Composition  Body Fat %: 48.6 % Fat Mass (lbs): 107.6 lbs Muscle Mass (lbs): 107.8 lbs Total Body Water (lbs): 85.6 lbs Visceral Fat Rating : 15   Other Clinical Data Fasting: no Labs: no Today's Visit #: 5 Starting Date: 05/28/22     HPI  Chief Complaint: OBESITY  Julia Gomez is here to discuss her progress with her obesity treatment plan. She is on the keeping a food journal and adhering to recommended goals of 1100-1200 calories and 85 grams of  protein and states she is following her eating plan approximately 85-90 % of the time. She states she is exercising cardio drumming/stretching 30-60 minutes 2 times per week.   Interval History:  Since last office visit she is down 5 lbs. Reports better adherence to nutrition plan.  Bio impedence scale reviewed with the patient:   Up 1.4 lbs muscle mass Down 7.2 lbs adipose mass  The patient, with a history of obesity, prediabetes, vitamin D deficiency and recent bunion surgery, presents for a follow-up visit. Weight loss of five pounds since the last visit.  She has been adhering to a diet plan, which includes mindful eating and portion control.  She has been managing to resist the temptation of overeating, even when dining with her husband.  She has also incorporated physical activity into her routine, including cardio drumming at a local senior center. Despite the physical limitation due to the bunion surgery, she has been able to maintain an active lifestyle. She is currently  wearing a boot, which she removes at night. She is expected to wear the boot until the end of October.  In addition to her weight management efforts, she has been managing an overactive bladder with Botox treatments. She reports that the treatments are effective but only last for about three to four months. She also reports no issues with her current medication, Metformin. She has a trip to visit a friend this weekend and has good strategies in mind to stay on track with nutrition plan.   Pharmacotherapy: metformin for prediabetes. No GI or other side effects with metformin.   TREATMENT PLAN FOR OBESITY: Successful weight loss of 5 pounds with increased muscle mass and decreased adipose tissue. Better adherence to diet and exercise regimen, including cardio drumming and mindful eating. -Continue current nutrition plan and exercise regimen.  Recommended Dietary Goals  Julia Gomez is currently in the action stage of change. As such, her goal is to continue weight management plan. She has agreed to keeping a food journal and adhering to recommended goals of 1100-1200 calories and 85 grams of  protein.  Behavioral Intervention  We discussed the following Behavioral Modification Strategies today: increasing lean protein intake, decreasing simple carbohydrates , increasing vegetables, increasing lower glycemic fruits, increasing water intake, continue to practice mindfulness when eating, planning for success, and staying on track while traveling and vacationing.  Additional resources provided today: NA  Recommended Physical Activity Goals  Julia Gomez has been advised to work up to 150 minutes of moderate intensity  aerobic activity a week and strengthening exercises 2-3 times per week for cardiovascular health, weight loss maintenance and preservation of muscle mass.   She has agreed to Continue current level of physical activity    Pharmacotherapy We discussed various medication options to help  Julia Gomez with her weight loss efforts and we both agreed to continue metformin for prediabetes.    Return in about 4 weeks (around 11/10/2022).Marland Kitchen She was informed of the importance of frequent follow up visits to maximize her success with intensive lifestyle modifications for her multiple health conditions.  PHYSICAL EXAM:  Blood pressure 132/82, pulse 83, temperature 98.3 F (36.8 C), height 5\' 6"  (1.676 m), weight 221 lb (100.2 kg), SpO2 96%. Body mass index is 35.67 kg/m.  General: She is overweight, cooperative, alert, well developed, and in no acute distress. Ambulates with post op shoe in place on left foot.  PSYCH: Has normal mood, affect and thought process.   Cardiovascular: HR 80's BP 132/82 Lungs: Normal breathing effort, no conversational dyspnea.  DIAGNOSTIC DATA REVIEWED:  BMET    Component Value Date/Time   NA 141 05/28/2022 1333   K 4.7 05/28/2022 1333   CL 103 05/28/2022 1333   CO2 22 05/28/2022 1333   GLUCOSE 107 (H) 05/28/2022 1333   GLUCOSE 106 (H) 09/19/2019 0725   BUN 12 05/28/2022 1333   CREATININE 1.17 (H) 05/28/2022 1333   CALCIUM 8.8 05/28/2022 1333   GFRNONAA >60 09/19/2019 0725   GFRAA >60 09/19/2019 0725   Lab Results  Component Value Date   HGBA1C 6.1 (H) 09/12/2019   Lab Results  Component Value Date   INSULIN 5.8 05/28/2022   No results found for: "TSH" CBC    Component Value Date/Time   WBC 8.9 09/19/2019 0725   RBC 3.25 (L) 09/19/2019 0725   HGB 9.6 (L) 09/19/2019 0725   HCT 31.1 (L) 09/19/2019 0725   PLT 277 09/19/2019 0725   MCV 95.7 09/19/2019 0725   MCH 29.5 09/19/2019 0725   MCHC 30.9 09/19/2019 0725   RDW 16.6 (H) 09/19/2019 0725   Iron Studies    Component Value Date/Time   FERRITIN 270 09/12/2019 1400   Lipid Panel     Component Value Date/Time   CHOL 162 05/28/2022 1333   TRIG 129 05/28/2022 1333   HDL 57 05/28/2022 1333   LDLCALC 82 05/28/2022 1333   Hepatic Function Panel     Component Value Date/Time    PROT 7.1 05/28/2022 1333   ALBUMIN 4.1 05/28/2022 1333   AST 18 05/28/2022 1333   ALT 18 05/28/2022 1333   ALKPHOS 179 (H) 05/28/2022 1333   BILITOT <0.2 05/28/2022 1333   No results found for: "TSH" Nutritional Lab Results  Component Value Date   VD25OH 69.1 05/28/2022    ASSOCIATED CONDITIONS ADDRESSED TODAY  ASSESSMENT AND PLAN  Problem List Items Addressed This Visit     Prediabetes - Primary   Relevant Medications   metFORMIN (GLUCOPHAGE) 500 MG tablet   Vitamin D deficiency   Obesity with starting BMI of 36.4   Relevant Medications   metFORMIN (GLUCOPHAGE) 500 MG tablet   BMI 35.0-35.9,adult   Prediabetes Last A1c was 6.1- not at goal  Medication(s): Metformin 500 mg once daily breakfast No Gi or other side effects.  Polyphagia:No She is working on nutrition plan to decrease simple carbohydrates, increase lean proteins and exercise to promote weight loss, improve glycemic control and prevent progression to Type 2 diabetes.   Lab Results  Component Value  Date   HGBA1C 6.1 (H) 09/12/2019   Lab Results  Component Value Date   INSULIN 5.8 05/28/2022    Plan: Continue and refill Metformin 500 mg once daily breakfast Continue working on nutrition plan to decrease simple carbohydrates, increase lean proteins and exercise to promote weight loss, improve glycemic control and prevent progression to Type 2 diabetes.   Vitamin D Deficiency Vitamin D is at goal of 50.  Most recent vitamin D level was 69.1. She is on OTC vitamin D3 5000 IU daily. No N/V or muscle weakness or other side effects with OTC vitamin D.  Lab Results  Component Value Date   VD25OH 69.1 05/28/2022    Plan: Continue OTC vitamin D3 5000 IU daily Low vitamin D levels can be associated with adiposity and may result in leptin resistance and weight gain. Also associated with fatigue. Currently on vitamin D supplementation without any adverse effects.    Overactive Bladder Managed with Botox  injections, with noted improvement in symptoms. No side effects reported from Botox. -Continue Botox injections as needed.    Follow-up in 1 month on Tuesday, November 10, 2022 at 315 PM. ATTESTASTION STATEMENTS:  Reviewed by clinician on day of visit: allergies, medications, problem list, medical history, surgical history, family history, social history, and previous encounter notes.   I have personally spent 30 minutes total time today in preparation, patient care, nutritional counseling and documentation for this visit, including the following: review of clinical lab tests; review of medical tests/procedures/services.      Johnryan Sao, PA-C

## 2022-11-10 ENCOUNTER — Ambulatory Visit (INDEPENDENT_AMBULATORY_CARE_PROVIDER_SITE_OTHER): Payer: PRIVATE HEALTH INSURANCE | Admitting: Physician Assistant

## 2022-11-12 ENCOUNTER — Ambulatory Visit (INDEPENDENT_AMBULATORY_CARE_PROVIDER_SITE_OTHER): Payer: PRIVATE HEALTH INSURANCE | Admitting: Family Medicine

## 2022-11-16 ENCOUNTER — Ambulatory Visit (INDEPENDENT_AMBULATORY_CARE_PROVIDER_SITE_OTHER): Payer: Medicare HMO | Admitting: Family Medicine

## 2022-11-16 ENCOUNTER — Encounter (INDEPENDENT_AMBULATORY_CARE_PROVIDER_SITE_OTHER): Payer: Self-pay | Admitting: Family Medicine

## 2022-11-16 VITALS — BP 117/69 | HR 91 | Temp 98.1°F | Ht 66.0 in | Wt 224.0 lb

## 2022-11-16 DIAGNOSIS — E669 Obesity, unspecified: Secondary | ICD-10-CM

## 2022-11-16 DIAGNOSIS — Z6836 Body mass index (BMI) 36.0-36.9, adult: Secondary | ICD-10-CM | POA: Diagnosis not present

## 2022-11-16 DIAGNOSIS — Z6835 Body mass index (BMI) 35.0-35.9, adult: Secondary | ICD-10-CM

## 2022-11-16 DIAGNOSIS — E559 Vitamin D deficiency, unspecified: Secondary | ICD-10-CM

## 2022-11-16 DIAGNOSIS — R7303 Prediabetes: Secondary | ICD-10-CM

## 2022-11-16 MED ORDER — METFORMIN HCL 500 MG PO TABS
500.0000 mg | ORAL_TABLET | Freq: Two times a day (BID) | ORAL | 0 refills | Status: DC
Start: 2022-11-16 — End: 2022-12-08

## 2022-11-16 NOTE — Progress Notes (Signed)
Julia Gomez, D.O.  ABFM, ABOM Specializing in Clinical Bariatric Medicine  Office located at: [address] [place], [place]  [address]     Assessment and Plan:  Review labs with pt next OV.  Orders Placed This Encounter  Procedures   COMPLETE METABOLIC PANEL WITH GFR   B12   Magnesium   Hemoglobin A1c   Medications Discontinued During This Encounter  Medication Reason   aspirin EC 325 MG tablet    metFORMIN (GLUCOPHAGE) 500 MG tablet Reorder    Meds ordered this encounter  Medications   metFORMIN (GLUCOPHAGE) 500 MG tablet    Sig: Take 1 tablet (500 mg total) by mouth 2 (two) times daily with a meal.    Dispense:  60 tablet    Refill:  0   Prediabetes Assessment: Condition is Not at goal.. This is being treated with Metformin started on 05/23. Pt serum creatinine was 1.17 with a eGFR of 50 on 05/28/2022 when Metformin was first initiated. She tolerates this without difficulty and denies any GI upset. Pt endorses occasionally craving peanut butter. She has a increase in hunger between breakfast and Gomez and she feels tempted to snack when her husband snacks around her late at night. Lab Results  Component Value Date   HGBA1C 6.1 (H) 09/12/2019   INSULIN 5.8 05/28/2022    Plan:- Continue Metformin  500mg  BID daily with a meal. I will refill.   - Healthy alternative to peanut butter and crackers discussed with pt such as using PB2 powder with apple slices or celery.   - Explained role of simple carbs and insulin levels on hunger and cravings  - Anticipatory guidance given.    - We will recheck A1c and fasting insulin level in approximately 3 months from last check, or as deemed appropriate.    Vitamin D deficiency Assessment: Condition is Not at goal.. Pt vitamin D levels are low at 69.1 since 05/28/2022. This is being currently treated with OTC vitamin D3 daily.  Lab Results  Component Value Date   VD25OH 69.1 05/28/2022   Plan: - Continue OTC vitamin  D3 5000 IU daily.   - I discussed the importance of vitamin D to the patient's health and well-being as well as to their ability to lose weight.   - ideal vitamin D levels reviewed with patient    - weight loss will likely improve availability of vitamin D, thus encouraged Julia Gomez to continue with meal plan and their weight loss efforts to further improve this condition.  Thus, we will need to monitor levels regularly (every 3-4 mo on average) to keep levels within normal limits and prevent over supplementation.   TREATMENT PLAN FOR OBESITY: BMI 35.0-35.9,adult Current BMI 36.17 Obesity with starting BMI of 36.4 Assessment:  Julia Gomez is here to discuss her progress with her obesity treatment plan along with follow-up of her obesity related diagnoses. See Medical Weight Management Flowsheet for complete bioelectrical impedance results.  Condition is not optimized. Biometric data collected today, was reviewed with patient.   Since last office visit on 10/13/2022 patient's  Muscle mass has decreased by 1lb. Fat mass has increased by 3.8lb. Total body water has increased by 3lb.  Counseling done on how various foods will affect these numbers and how to maximize success  Total lbs lost to date: 2 Total weight loss percentage to date: 0.88%   Plan:  Julia Gomez is currently in the action stage of change. As such, her goal is to continue  her weight management plan by journaling 1100-1200 calories and 85 grams of protein.  - Unless pre-existing renal or cardiopulmonary conditions exist which patient was told to limit their fluid intake by another provider, I recommended roughly one half of their weight in pounds, to be the approximate ounces of non-caloric, non-caffeinated beverages they should drink per day; including more if they are engaging in exercise. Increasing water intake and physical activity will help alleviate constipation symptoms.   Behavioral Intervention Additional resources  provided today: patient declined Evidence-based interventions for health behavior change were utilized today including the discussion of self monitoring techniques, problem-solving barriers and SMART goal setting techniques.   Regarding patient's less desirable eating habits and patterns, we employed the technique of small changes.  Pt will specifically work on: journal her intake and increase her water intake to 100oz daily for next visit.     She has agreed to Continue current level of physical activity    FOLLOW UP: Return in about 22 days (around 12/08/2022).  She was informed of the importance of frequent follow up visits to maximize her success with intensive lifestyle modifications for her multiple health conditions. Julia Gomez is aware that we will review all of her lab results at our next visit together in person.  She is aware that if anything is critical/ life threatening with the results, we will be contacting her via MyChart or by my CMA will be calling them prior to the office visit to discuss acute management.    Subjective:   Chief complaint: Obesity Julia Gomez is here to discuss her progress with her obesity treatment plan. She is on the keeping a food journal and adhering to recommended goals of 1100-1200 calories and 85 protein and states she is following her eating plan approximately 80% of the time. She states she is going to cardio drumming 30 minutes 2 days per week and arthritic foundation cardio 60 minute 2 days per week.   Interval History:  Julia Gomez is here for a follow up office visit.     Since last office visit:  This is my first time meeting with pt as she usually meets with Dr. Marquis Gomez and Julia Gomez. She informed me that she has an infected big toe but this is common and she sees a podiatrist Julia Gomez. Since her last OV she has been on vacation and was eating catered food. She informed e when she is at home and has spare calories she will eat a factor meal. She  states that she has been journaling on paper  We reviewed her meal plan and all questions were answered.   Pharmacotherapy for weight loss: She is currently taking  Metformin  for medical weight loss.  Denies side effects.    Review of Systems:  Pertinent positives were addressed with patient today.  Reviewed by clinician on day of visit: allergies, medications, problem list, medical history, surgical history, family history, social history, and previous encounter notes.  Weight Summary and Biometrics   Weight Lost Since Last Visit: 0lb  Weight Gained Since Last Visit: 3lb   Vitals Temp: 98.1 F (36.7 C) BP: 117/69 Pulse Rate: 91 SpO2: 97 %   Anthropometric Measurements Height: 5\' 6"  (1.676 m) Weight: 224 lb (101.6 kg) BMI (Calculated): 36.17 Weight at Last Visit: 221lb Weight Lost Since Last Visit: 0lb Weight Gained Since Last Visit: 3lb Starting Weight: 226 lb Total Weight Loss (lbs): 2 lb (0.907 kg) Peak Weight: 235 lb   Body Composition  Body  Fat %: 49.7 % Fat Mass (lbs): 111.4 lbs Muscle Mass (lbs): 106.8 lbs Total Body Water (lbs): 88.6 lbs Visceral Fat Rating : 16   Other Clinical Data Fasting: no Labs: no Today's Visit #: 6 Starting Date: 05/28/22     Objective:   PHYSICAL EXAM: Blood pressure 117/69, pulse 91, temperature 98.1 F (36.7 C), height 5\' 6"  (1.676 m), weight 224 lb (101.6 kg), SpO2 97%. Body mass index is 36.15 kg/m.  General: Well Developed, well nourished, and in no acute distress.  HEENT: Normocephalic, atraumatic Skin: Warm and dry, cap RF less 2 sec, good turgor Chest:  Normal excursion, shape, no gross abn Respiratory: speaking in full sentences, no conversational dyspnea NeuroM-Sk: Ambulates w/o assistance, moves * 4 Psych: A and O *3, insight good, mood-full  DIAGNOSTIC DATA REVIEWED:  BMET    Component Value Date/Time   NA 141 05/28/2022 1333   K 4.7 05/28/2022 1333   CL 103 05/28/2022 1333   CO2 22 05/28/2022  1333   GLUCOSE 107 (H) 05/28/2022 1333   GLUCOSE 106 (H) 09/19/2019 0725   BUN 12 05/28/2022 1333   CREATININE 1.17 (H) 05/28/2022 1333   CALCIUM 8.8 05/28/2022 1333   GFRNONAA >60 09/19/2019 0725   GFRAA >60 09/19/2019 0725   Lab Results  Component Value Date   HGBA1C 6.1 (H) 09/12/2019   Lab Results  Component Value Date   INSULIN 5.8 05/28/2022   No results found for: "TSH" CBC    Component Value Date/Time   WBC 8.9 09/19/2019 0725   RBC 3.25 (L) 09/19/2019 0725   HGB 9.6 (L) 09/19/2019 0725   HCT 31.1 (L) 09/19/2019 0725   PLT 277 09/19/2019 0725   MCV 95.7 09/19/2019 0725   MCH 29.5 09/19/2019 0725   MCHC 30.9 09/19/2019 0725   RDW 16.6 (H) 09/19/2019 0725   Iron Studies    Component Value Date/Time   FERRITIN 270 09/12/2019 1400   Lipid Panel     Component Value Date/Time   CHOL 162 05/28/2022 1333   TRIG 129 05/28/2022 1333   HDL 57 05/28/2022 1333   LDLCALC 82 05/28/2022 1333   Hepatic Function Panel     Component Value Date/Time   PROT 7.1 05/28/2022 1333   ALBUMIN 4.1 05/28/2022 1333   AST 18 05/28/2022 1333   ALT 18 05/28/2022 1333   ALKPHOS 179 (H) 05/28/2022 1333   BILITOT <0.2 05/28/2022 1333   No results found for: "TSH" Nutritional Lab Results  Component Value Date   VD25OH 69.1 05/28/2022    Attestations:   This encounter took 40 total minutes of time including any pre-visit and post-visit time spent on this date of service, including taking a thorough history, reviewing any labs and/or imaging, reviewing prior notes, as well as documenting in the electronic health record on the date of service. Over 50% of that time was in direct face-to-face counseling and coordinating care for the patient today  I, Clinical biochemist, acting as a Stage manager for [employer], DO., have compiled all relevant documentation for today's office visit on behalf of Thomasene Lot, DO, while in the presence of [employer], DO.  I have reviewed  the above documentation for accuracy and completeness, and I agree with the above. Julia Gomez, D.O.  The 21st Century Cures Act was signed into law in 2016 which includes the topic of electronic health records.  This provides immediate access to information in MyChart.  This includes consultation notes, operative notes, office  notes, lab results and pathology reports.  If you have any questions about what you read please let us know at your next visit so we can discuss your concerns and take corrective action if need be.  We are right here with you.

## 2022-11-17 LAB — MAGNESIUM: Magnesium: 2.1 mg/dL (ref 1.6–2.3)

## 2022-11-17 LAB — HEMOGLOBIN A1C
Est. average glucose Bld gHb Est-mCnc: 123 mg/dL
Hgb A1c MFr Bld: 5.9 % — ABNORMAL HIGH (ref 4.8–5.6)

## 2022-11-17 LAB — VITAMIN B12: Vitamin B-12: 383 pg/mL (ref 232–1245)

## 2022-12-08 ENCOUNTER — Ambulatory Visit (INDEPENDENT_AMBULATORY_CARE_PROVIDER_SITE_OTHER): Payer: Medicare HMO | Admitting: Physician Assistant

## 2022-12-08 ENCOUNTER — Encounter (INDEPENDENT_AMBULATORY_CARE_PROVIDER_SITE_OTHER): Payer: Self-pay | Admitting: Physician Assistant

## 2022-12-08 VITALS — BP 136/82 | HR 86 | Temp 97.9°F | Ht 66.0 in | Wt 218.0 lb

## 2022-12-08 DIAGNOSIS — E559 Vitamin D deficiency, unspecified: Secondary | ICD-10-CM

## 2022-12-08 DIAGNOSIS — R7303 Prediabetes: Secondary | ICD-10-CM | POA: Diagnosis not present

## 2022-12-08 DIAGNOSIS — Z6835 Body mass index (BMI) 35.0-35.9, adult: Secondary | ICD-10-CM

## 2022-12-08 MED ORDER — METFORMIN HCL 500 MG PO TABS: 500.0000 mg | ORAL_TABLET | Freq: Two times a day (BID) | ORAL | 0 refills | Status: DC

## 2022-12-08 NOTE — Progress Notes (Signed)
SUBJECTIVE:  Chief Complaint: Obesity  Interim History: The patient, under treatment for obesity, prediabetes, and hyperlipidemia, has shown significant progress in her weight loss journey. She has lost six pounds since the last visit, which she attributes to a combination of consuming [employer] meal delivery meals, calorie tracking, and a high protein diet.  The patient aims to consume around 90 grams of protein daily.  This dietary adjustment has resulted in a decrease in adipose tissue from 111.4 pounds to 106.2 pounds, reducing the body adipose percentage to 48.6%. The patient's visceral adipose rating has also improved, dropping to 15 with goal of 12 or less.  The patient's blood pressure and heart rate are within normal limits. She is currently on Metformin for prediabetes and Lipitor (20mg  nightly) for hyperlipidemia. The patient reports feeling slightly queasy and experiencing hunger in the afternoon since starting Metformin.  The patient's recent A1c level was 5.9, down from 6.1, indicating an improvement in her prediabetic condition.   In terms of physical activity, the patient continues to participate in cardio drumming. She is also planning a 12-day cruise, during which she intends to stay active and maintain her dietary regimen. The patient also anticipates challenges with upcoming holiday parties and has discussed strategies to manage her diet during these events.  Julia Gomez is here to discuss her progress with her obesity treatment plan. She is on the keeping a food journal and adhering to recommended goals of 1100-1200 calories and 85 grams of protein and states she is following her eating plan approximately 80 % of the time. She states she is exercising cardio drumming/stretching class for arthritis 30-60 minutes 2 times per week.   OBJECTIVE: Visit Diagnoses: Problem List Items Addressed This Visit     Prediabetes - Primary   Relevant Medications   metFORMIN (GLUCOPHAGE) 500  MG tablet   Vitamin D deficiency   Obesity with starting BMI of 36.4   Relevant Medications   metFORMIN (GLUCOPHAGE) 500 MG tablet  Obesity Bio impedence scale reviewed with the patient:   Maintained muscle mass, adipose decreased by 5.2 lbs and visceral adipose rating to 15.  Using [employer] meal delivery, tracking calories, and increasing protein intake to maintain muscle mass.  Engaging in cardio drumming for physical activity.  Discussed strategies for maintaining weight during upcoming cruise and holiday parties, including protein loading prior to parties and staying active. - Continue Factor meals and calorie tracking - Maintain protein intake at 80-90 grams per day - Stay active during cruise and holiday parties - Protein loading before holiday parties - Walk within 30 minutes of eating to aid digestion and prevent blood sugar spikes  Prediabetes Lab Results  Component Value Date   HGBA1C 5.9 (H) 11/16/2022   HGBA1C 6.1 (H) 09/12/2019   Lab Results  Component Value Date   LDLCALC 82 05/28/2022   CREATININE 1.17 (H) 05/28/2022   A1c improved from 6.1% to 5.9%, indicating better blood sugar control but still within prediabetic range. Currently taking metformin but experiencing mild queasiness. Discussed taking metformin after eating to reduce nausea. Plan:  Continue working on nutrition plan to decrease simple carbohydrates, increase lean proteins and exercise to promote weight loss, improve glycemic control and prevent progression to Type 2 diabetes.  - Refill/continue  metformin 500 mg BID  - Take metformin after eating to reduce nausea - Monitor A1c levels regularly - Follow up on January 12, 2023, at 12:30 PM  Hyperlipidemia On Lipitor 20 mg nightly for hyperlipidemia. No reports of muscle  pain or other side effects.  - Continue Lipitor 20 mg nightly - Monitor lipid levels regularly  General Health Maintenance Preparing for a cruise and holiday season, which may pose  challenges to health regimen. - Stay active and mindful eating during the cruise and holiday season - Schedule follow-up appointment for January 12, 2023, at 12:30 PM.  Vitals Temp: 97.9 F (36.6 C) BP: 136/82 Pulse Rate: 86 SpO2: 98 %   Anthropometric Measurements Height: 5\' 6"  (1.676 m) Weight: 218 lb (98.9 kg) BMI (Calculated): 35.2 Weight at Last Visit: 224 lb Weight Lost Since Last Visit: 6 lb Weight Gained Since Last Visit: 0 Starting Weight: 226 lb Total Weight Loss (lbs): 8 lb (3.629 kg) Peak Weight: 235 lb   Body Composition  Body Fat %: 48.6 % Fat Mass (lbs): 106.2 lbs Muscle Mass (lbs): 106.8 lbs Total Body Water (lbs): 88.2 lbs Visceral Fat Rating : 15   Other Clinical Data Fasting: no Labs: no Today's Visit #: 7 Starting Date: 05/28/22     ASSESSMENT AND PLAN:  Diet: Julia Gomez is currently in the action stage of change. As such, her goal is to continue with weight loss efforts. She has agreed to keeping a food journal and adhering to recommended goals of 1100-1200 calories and 80-90 grams of protein.  Exercise: Julia Gomez has been instructed to continue exercising as is for weight loss and overall health benefits.   Behavior Modification:  We discussed the following Behavioral Modification Strategies today: increasing lean protein intake, decreasing simple carbohydrates, increasing vegetables, increase H2O intake, increase high fiber foods, travel eating strategies, and holiday eating strategies. We discussed various medication options to help Julia Gomez with her weight loss efforts and we both agreed to continue metformin 500 mg twice daily for prediabetes.  Return in about 4 weeks (around 01/05/2023).Marland Kitchen She was informed of the importance of frequent follow up visits to maximize her success with intensive lifestyle modifications for her multiple health conditions.  Attestation Statements:   Reviewed by clinician on day of visit: allergies, medications,  problem list, medical history, surgical history, family history, social history, and previous encounter notes.   Time spent on visit including pre-visit chart review and post-visit care and charting was 32 minutes.    Shamecca Whitebread, PA-C

## 2023-01-11 NOTE — Progress Notes (Unsigned)
SUBJECTIVE:  Chief Complaint: Obesity  Interim History: She is up 6 lbs since her last visit.  Julia Gomez, a 72 year old patient with a history of prediabetes and vitamin D deficiency, presents for a follow-up visit regarding her obesity treatment plan. The patient recently returned from a two-week cruise, during which she was unable to adhere strictly to her dietary plan due to the lack of food labels. Despite this, she attempted to maintain a balanced diet by incorporating salads and light breakfasts.  Upon her return, the patient has had slight increase in adipose tissue, but also an increase in muscle mass, likely due to increased physical activity during the trip. She has since incorporated cycling into her routine, using a fold-up bicycle every other day.  The patient also reports a change in bowel habits since starting metformin, noting that her stools are less formed than they used to be. However, she denies experiencing diarrhea. She has been taking the medication after meals, which has helped alleviate some of the associated gastrointestinal discomfort.  The patient plans to continue her physical activity regimen in the new year, with a focus on consistency in attending [employer] classes. She also plans to resume journaling and label reading as part of her dietary plan. The patient is scheduled for another cruise in April, and she aims to maintain her health and fitness levels in preparation for this trip. She and her husband utilize [employer] meal delivery when they are not traveling.   Julia Gomez is here to discuss her progress with her obesity treatment plan. She is on the keeping a food journal and adhering to recommended goals of 1100-1200 calories and 80-90 grams of protein and states she is following her eating plan approximately 0 % of the time. She states she is exercising stationary bike 10 minutes 3-4 times per week.   OBJECTIVE: Visit Diagnoses: Problem List Items  Addressed This Visit     Prediabetes - Primary   Vitamin D deficiency   Obesity with starting BMI of 36.4   Other Visit Diagnoses       BMI 36.0-36.9,adult Current BMI 36.3         Obesity 72 year old with obesity, recently gained 2.8 pounds of adipose tissue and 3.2 pounds of muscle after a two-week cruise. Increased physical activity using a fold-up bicycle and attending [employer] classes. Focus on maintaining weight loss and increasing muscle mass through consistent exercise and dietary monitoring. Discussed benefits of regular exercise and dietary monitoring, including improved energy levels and long-term weight management. Prefers to continue current exercise regimen and dietary monitoring. - Continue using the fold-up bicycle every other day - Attend [employer] classes regularly - Resume food journaling and label reading - Aim for 80-90 grams of protein intake daily  Prediabetes Lab Results  Component Value Date   HGBA1C 5.9 (H) 11/16/2022   HGBA1C 6.1 (H) 09/12/2019   Lab Results  Component Value Date   LDLCALC 82 05/28/2022   CREATININE 1.17 (H) 05/28/2022    Prediabetes, currently on metformin with some gastrointestinal side effects, including changes in stool consistency. Advised to take metformin with food, which has helped mitigate side effects. Considering adjusting timing of doses to manage midnight cravings. Discussed importance of monitoring stool consistency and hydration status to prevent dehydration. Prefers to continue current medication regimen with adjustments as needed. - Continue metformin as prescribed - Take metformin with food to reduce gastrointestinal side effects - Monitor stool consistency and hydration status - Check labs, including A1c,  in February or March  Vitamin D Deficiency Vitamin D is at goal of 33.  Most recent vitamin D level was 69.1. She is on OTC vitamin D3 5000 IU daily. Lab Results  Component Value Date   VD25OH 69.1  05/28/2022    Plan: Continue OTC vitamin D3 5000 IU daily Low vitamin D levels can be associated with adiposity and may result in leptin resistance and weight gain. Also associated with fatigue. Currently on vitamin D supplementation without any adverse effects.  Plan to recheck vitamin D levels over the next 1-2 months if not checked by PCP to optimize supplementation/avoid over supplementation.   General Health Maintenance Generally in good health and engaging in physical activities such as walking and cardio drumming. No immediate plans for additional cruises until April, allowing time to focus on health and fitness goals. - Schedule follow-up appointment in four weeks on February 10, 2023, at 3 PM.  Vitals Temp: 98.1 F (36.7 C) BP: 130/80 Pulse Rate: 81 SpO2: 99 %   Anthropometric Measurements Height: 5\' 6"  (1.676 m) Weight: 224 lb (101.6 kg) BMI (Calculated): 36.17 Weight at Last Visit: 218lb Weight Lost Since Last Visit: 0lb Weight Gained Since Last Visit: 6lb Starting Weight: 226 lb Total Weight Loss (lbs): 2 lb (0.907 kg) Peak Weight: 235 lb   Body Composition  Body Fat %: 48.5 % Fat Mass (lbs): 109 lbs Muscle Mass (lbs): 110 lbs Total Body Water (lbs): 85.4 lbs Visceral Fat Rating : 15   Other Clinical Data Fasting: no Labs: no Today's Visit #: 8 Starting Date: 05/28/22     ASSESSMENT AND PLAN:  Diet: Julia Gomez is currently in the action stage of change. As such, her goal is to continue with weight loss efforts. She has agreed to keeping a food journal and adhering to recommended goals of 1100-1200 calories and 80-90 grams of protein.  Exercise: Julia Gomez has been instructed to continue exercising as is for weight loss and overall health benefits.   Behavior Modification:  We discussed the following Behavioral Modification Strategies today: increasing lean protein intake, decreasing simple carbohydrates, increasing vegetables, increase H2O intake,  increase high fiber foods, emotional eating strategies , holiday eating strategies, and planning for success. We discussed various medication options to help Julia Gomez with her weight loss efforts and we both agreed to continue metformin for primary indication of prediabetes.  Return in about 4 weeks (around 02/09/2023).Marland Kitchen She was informed of the importance of frequent follow up visits to maximize her success with intensive lifestyle modifications for her multiple health conditions.  Attestation Statements:   Reviewed by clinician on day of visit: allergies, medications, problem list, medical history, surgical history, family history, social history, and previous encounter notes.   Time spent on visit including pre-visit chart review and post-visit care and charting was 25 minutes.    Carmin Alvidrez, PA-C

## 2023-01-12 ENCOUNTER — Ambulatory Visit (INDEPENDENT_AMBULATORY_CARE_PROVIDER_SITE_OTHER): Payer: Medicare HMO | Admitting: Physician Assistant

## 2023-01-12 VITALS — BP 130/80 | HR 81 | Temp 98.1°F | Ht 66.0 in | Wt 224.0 lb

## 2023-01-12 DIAGNOSIS — R7303 Prediabetes: Secondary | ICD-10-CM

## 2023-01-12 DIAGNOSIS — Z6836 Body mass index (BMI) 36.0-36.9, adult: Secondary | ICD-10-CM | POA: Diagnosis not present

## 2023-01-12 DIAGNOSIS — E669 Obesity, unspecified: Secondary | ICD-10-CM

## 2023-01-12 DIAGNOSIS — E559 Vitamin D deficiency, unspecified: Secondary | ICD-10-CM

## 2023-01-13 ENCOUNTER — Encounter (INDEPENDENT_AMBULATORY_CARE_PROVIDER_SITE_OTHER): Payer: Self-pay | Admitting: Physician Assistant

## 2023-02-04 ENCOUNTER — Other Ambulatory Visit (INDEPENDENT_AMBULATORY_CARE_PROVIDER_SITE_OTHER): Payer: Self-pay | Admitting: Physician Assistant

## 2023-02-04 DIAGNOSIS — R7303 Prediabetes: Secondary | ICD-10-CM

## 2023-02-10 ENCOUNTER — Ambulatory Visit (INDEPENDENT_AMBULATORY_CARE_PROVIDER_SITE_OTHER): Payer: PRIVATE HEALTH INSURANCE | Admitting: Physician Assistant

## 2023-03-03 ENCOUNTER — Ambulatory Visit (INDEPENDENT_AMBULATORY_CARE_PROVIDER_SITE_OTHER): Payer: No Typology Code available for payment source | Admitting: Physician Assistant

## 2023-03-03 ENCOUNTER — Encounter (INDEPENDENT_AMBULATORY_CARE_PROVIDER_SITE_OTHER): Payer: Self-pay | Admitting: Physician Assistant

## 2023-03-03 VITALS — BP 120/72 | HR 79 | Temp 98.7°F | Ht 66.0 in | Wt 225.0 lb

## 2023-03-03 DIAGNOSIS — Z6836 Body mass index (BMI) 36.0-36.9, adult: Secondary | ICD-10-CM

## 2023-03-03 DIAGNOSIS — R7303 Prediabetes: Secondary | ICD-10-CM | POA: Diagnosis not present

## 2023-03-03 DIAGNOSIS — E559 Vitamin D deficiency, unspecified: Secondary | ICD-10-CM

## 2023-03-03 DIAGNOSIS — E7849 Other hyperlipidemia: Secondary | ICD-10-CM | POA: Diagnosis not present

## 2023-03-03 DIAGNOSIS — E669 Obesity, unspecified: Secondary | ICD-10-CM | POA: Diagnosis not present

## 2023-03-03 MED ORDER — METFORMIN HCL 500 MG PO TABS: 500.0000 mg | ORAL_TABLET | Freq: Two times a day (BID) | ORAL | 0 refills | Status: DC

## 2023-03-03 NOTE — Progress Notes (Signed)
SUBJECTIVE:  Chief Complaint: Obesity  Interim History: She is up 1 lb since last visit.   Julia Gomez is a 73 year old female who presents for follow-up of her obesity treatment plan.  She has gained one pound since her last visit. The winter months pose a challenge for her motivation due to the lack of sunshine and shorter days, impacting her mood and motivation. She engages in 'snacky comfort eating' with foods like stews and soups accompanied by crackers, which she acknowledges as a dietary challenge. She tries to maintain her protein intake and sometimes exceeds her protein goals, but this occasionally leads to exceeding her calorie goals as well. She is mindful of her calorie intake, aiming to avoid consistently high-calorie days.  She continues to engage in physical activity, including walking and cardio drumming. She has no major trips planned until April, when she will go on an eight-day cruise. She uses [employer] meals and is mindful of her calorie intake throughout the day to accommodate these meals. Nighttime snacking habits are influenced by her husband's preferences, such as eating yogurt with graham crackers. She is trying to make healthier choices like yogurt or protein bars for snacks.  She is currently taking metformin but not as consistently as she should and wants to take it twice a day, typically at breakfast and dinner.  The cold weather discourages her from outdoor activities, and she prefers staying indoors. She is interested in chair exercises and other indoor activities to maintain mobility. She enjoys crocheting, which helps distract her from food cravings. No issues with sleep despite the seasonal changes.  Julia Gomez is here to discuss her progress with her obesity treatment plan. She is on the keeping a food journal and adhering to recommended goals of 1100-1200 calories and 80-90 grams of protein and states she is following her eating plan approximately 70 % of the  time. She states she is exercising walking 60-90 minutes 2 times per week.   OBJECTIVE: Visit Diagnoses: Problem List Items Addressed This Visit     Prediabetes - Primary   Relevant Medications   metFORMIN (GLUCOPHAGE) 500 MG tablet   Other hyperlipidemia   Vitamin D deficiency   Obesity with starting BMI of 36.4   Relevant Medications   metFORMIN (GLUCOPHAGE) 500 MG tablet  Obesity Julia Gomez, 73 year old female, has gained one pound since the last visit. She reports difficulty staying motivated during winter, engaging in comfort eating. She maintains muscle mass and adequate protein intake but may exceed calorie goals. Continues walking and cardio drumming. Discussed light therapy for seasonal affective disorder, portion control, mindful eating, and high-protein, low-calorie snacks. Encouraged journaling for calorie and protein tracking and recommended chair exercises and chair yoga from [employer]. - Encourage light therapy for seasonal affective disorder. - Recommend substituting crackers with whole grain bread. - Advise on portion control and mindful eating. - Suggest high-protein, low-calorie snacks such as Quest chips and Wild chips. - Encourage journaling to track calorie and protein intake. - Recommend chair exercises and chair yoga from YouTube.  Prediabetes Currently on metformin but not adhering to the prescribed regimen. Discussed the importance of taking metformin twice daily, with doses at least four hours apart. Suggested experimenting with taking it at lunch to control evening cravings. - Refill metformin prescription and send to [facility] on [address]. - Advise taking metformin twice daily, with doses at least four hours apart. - Suggest experimenting with taking metformin at lunch to control evening cravings.  Vitamin D Deficiency No  specific discussion or changes in management during this visit.  Hyperlipidemia No specific discussion or changes in management during this  visit.  General Health Maintenance Encouraged maintaining physical activity and healthier food choices. Discussed benefits of early morning sunshine for circadian rhythm and engaging in non-food-based activities like crocheting to distract from cravings. - Encourage early morning sunshine for 15-20 minutes to help set circadian rhythm. - Recommend engaging in non-food-based activities like crocheting to distract from cravings.  Follow-up - Schedule follow-up appointment for Tuesday, March 30, 2023, at 3:30 PM.  Vitals Temp: 98.7 F (37.1 C) BP: 120/72 (left arm) Pulse Rate: 79 SpO2: 97 %   Anthropometric Measurements Height: 5\' 6"  (1.676 m) Weight: 225 lb (102.1 kg) BMI (Calculated): 36.33 Weight at Last Visit: 224 lb Weight Lost Since Last Visit: 0 Weight Gained Since Last Visit: 1 lb Starting Weight: 226 lb Total Weight Loss (lbs): 1 lb (0.454 kg) Peak Weight: 235 lb   Body Composition  Body Fat %: 49.3 % Fat Mass (lbs): 111 lbs Muscle Mass (lbs): 108.2 lbs Total Body Water (lbs): 87.8 lbs Visceral Fat Rating : 16   Other Clinical Data Fasting: no Labs: no Today's Visit #: 9 Starting Date: 05/28/22     ASSESSMENT AND PLAN:  Diet: Julia Gomez is currently in the action stage of change. As such, her goal is to continue with weight loss efforts. She has agreed to keeping a food journal and adhering to recommended goals of 1100-1200 calories and 80-90 grams of protein.  Exercise: Julia Gomez has been instructed to continue exercising as is and consider chair yoga or chair exercise programs on video  for weight loss and overall health benefits.   Behavior Modification:  We discussed the following Behavioral Modification Strategies today: increasing lean protein intake, decreasing simple carbohydrates, increasing vegetables, increase H2O intake, increase high fiber foods, no skipping meals, meal planning and cooking strategies, better snacking choices, avoiding  temptations, planning for success, and keep a strict food journal. We discussed various medication options to help Julia Gomez with her weight loss efforts and we both agreed to continue metformin for primary indication of prediabetes and continue to work on nutritional and behavioral strategies to promote weight loss.  .  Return in about 4 weeks (around 03/31/2023).Marland Kitchen She was informed of the importance of frequent follow up visits to maximize her success with intensive lifestyle modifications for her multiple health conditions.  Attestation Statements:   Reviewed by clinician on day of visit: allergies, medications, problem list, medical history, surgical history, family history, social history, and previous encounter notes.   Time spent on visit including pre-visit chart review and post-visit care and charting was 44 minutes.    Julia Barga, PA-C

## 2023-03-30 ENCOUNTER — Encounter (INDEPENDENT_AMBULATORY_CARE_PROVIDER_SITE_OTHER): Payer: Self-pay | Admitting: Physician Assistant

## 2023-03-30 ENCOUNTER — Ambulatory Visit (INDEPENDENT_AMBULATORY_CARE_PROVIDER_SITE_OTHER): Payer: PRIVATE HEALTH INSURANCE | Admitting: Physician Assistant

## 2023-03-30 VITALS — BP 139/79 | HR 82 | Temp 98.0°F | Ht 66.0 in | Wt 229.0 lb

## 2023-03-30 DIAGNOSIS — Z6837 Body mass index (BMI) 37.0-37.9, adult: Secondary | ICD-10-CM

## 2023-03-30 DIAGNOSIS — E785 Hyperlipidemia, unspecified: Secondary | ICD-10-CM | POA: Diagnosis not present

## 2023-03-30 DIAGNOSIS — R7303 Prediabetes: Secondary | ICD-10-CM

## 2023-03-30 DIAGNOSIS — E7849 Other hyperlipidemia: Secondary | ICD-10-CM

## 2023-03-30 DIAGNOSIS — E669 Obesity, unspecified: Secondary | ICD-10-CM | POA: Diagnosis not present

## 2023-03-30 MED ORDER — TOPIRAMATE 25 MG PO TABS: 25.0000 mg | ORAL_TABLET | Freq: Every day | ORAL | 0 refills | Status: DC

## 2023-03-30 NOTE — Progress Notes (Signed)
 SUBJECTIVE: Discussed the use of AI scribe software for clinical note transcription with the patient, who gave verbal consent to proceed.  Chief Complaint: Obesity  Interim History: She is up 4 lbs since last visit.  Bio impedence scale reviewed with the patient:  Muscle mass + 2.8 lbs Adipose mass - 1.6 lbs  Julia Gomez is here to discuss her progress with her obesity treatment plan. She is on the keeping a food journal and adhering to recommended goals of 1100-1200 calories and 80-90 grams of protein and states she is following her eating plan approximately 50 % of the time. She states she is exercising arthritis flex class/stationary bike 30-60 minutes 2-3 times per week. The patient is a 73 year old with obesity who presents for follow-up of her obesity treatment plan.  She is experiencing challenges with weight management, particularly due to nocturnal snacking influenced by her husband, who often brings her snacks like fresh baked biscuits with guava jelly. Her morning routine includes eating a yogurt, and for lunch, she typically has [place] high protein waffles with sugar-free syrup. For dinner, she has been consuming Factor meals but is considering switching to Lean Cuisine to reduce calorie intake.  She has started using an exercise bike, having ridden it once for ten minutes, and plans to increase her activity gradually. She has gained some muscle mass, which she attributes to her efforts.  She is currently taking metformin twice a day, though she sometimes forgets the lunchtime dose and takes it at supper instead. She experiences occasional queasiness with metformin but generally tolerates it well.  No history of glaucoma, kidney stones, or seizures.  OBJECTIVE: Visit Diagnoses: Problem List Items Addressed This Visit     Prediabetes - Primary   Relevant Medications   topiramate (TOPAMAX) 25 MG tablet   Vitamin D deficiency   Obesity with starting BMI of 36.4   Other Visit  Diagnoses       BMI 37.0-37.9, adult Current BMI 37.1         Obesity Struggling with weight management due to nocturnal snacking influenced by her husband.  Gained muscle mass, which is positive, but weight is not at the desired goal. Currently on a diet plan of 1100-1200 calories /85+ grams of protein per day but not consistently journaling intake.  Using an exercise bike sporadically.   Discussed potential use of topiramate to help curb cravings, especially in the evening. Agreed to try a low dose. Patient denies history of glaucoma or kidney stones. She was informed of side effects and that topiramate is teratogenic. She is post menopausal.  Discussed increasing protein intake and managing meal portions to aid in weight management.  - Prescribe topiramate 25 mg daily at dinner to help curb evening cravings. - Encourage use of the exercise bike for at least 10 minutes twice a week. - Advise increasing protein intake, especially at breakfast, and consider using protein shakes. - Suggest avoiding evening snacks and consider alternative activities like biking after dinner.  Prediabetes with cravings On metformin twice daily but sometimes forgets the morning dose. Suggested taking metformin with dinner to help control post-dinner hunger. Increasing the metformin dose was considered but not preferred due to potential gastrointestinal side effects. Emphasized importance of maintaining a balanced diet and exercise to manage blood glucose levels. Metformin helps keep insulin levels in check, reducing hunger. Lab Results  Component Value Date   HGBA1C 5.9 (H) 11/16/2022   HGBA1C 6.1 (H) 09/12/2019   Lab Results  Component  Value Date   LDLCALC 82 05/28/2022   CREATININE 1.17 (H) 05/28/2022   INSULIN  Date Value Ref Range Status  05/28/2022 5.8 2.6 - 24.9 uIU/mL Final  ] - Continue metformin 500 mg twice daily, with the option to take the second dose at dinner if the morning dose is  missed. Start topiramate 25 mg in the evenings once daily to help with cravings after dinner meals.  Continue working on nutrition plan to decrease simple carbohydrates, increase lean proteins and exercise to promote weight loss, improve glycemic control and prevent progression to Type 2 diabetes.    Hyperlipidemia Diet includes some high-fat foods, such as waffles with syrup, which may contribute to hyperlipidemia. Discussed nutritional content of meals and suggested alternatives to reduce fat intake. Last lipids Lab Results  Component Value Date   CHOL 162 05/28/2022   HDL 57 05/28/2022   LDLCALC 82 05/28/2022   TRIG 129 05/28/2022   Continue atorvastatin 20 mg daily - Encourage dietary modifications to reduce saturated fat intake, such as choosing lower-fat meal options. Continue periodic monitoring of lipids to optimize treatment   Follow-up Scheduled for a follow-up appointment to assess the effectiveness of the topiramate and overall progress with weight management. - Schedule follow-up appointment for Tuesday, April 15th at 3:30 PM.  Vitals Temp: 98 F (36.7 C) BP: 139/79 Pulse Rate: 82 SpO2: 98 %   Anthropometric Measurements Height: 5\' 6"  (1.676 m) Weight: 229 lb (103.9 kg) BMI (Calculated): 36.98 Weight at Last Visit: 224lb Weight Lost Since Last Visit: 0 Weight Gained Since Last Visit: 4lb Starting Weight: 226lb Total Weight Loss (lbs): 0 lb (0 kg) Peak Weight: 235lb   Body Composition  Body Fat %: 49 % Fat Mass (lbs): 112.6 lbs Muscle Mass (lbs): 111 lbs Total Body Water (lbs): 88.2 lbs Visceral Fat Rating : 16   Other Clinical Data Fasting: no Labs: no Today's Visit #: 10 Starting Date: 05/28/22     ASSESSMENT AND PLAN:  Diet: Julia Gomez is currently in the action stage of change. As such, her goal is to continue with weight loss efforts and has agreed to keeping a food journal and adhering to recommended goals of 1100-1200 calories and 80-90  grams of protein.   Exercise:  Continue current exercise and gradually increase frequency of stationary bike riding  Behavior Modification:  We discussed the following Behavioral Modification Strategies today: increasing lean protein intake, decreasing simple carbohydrates, increasing vegetables, increase H2O intake, increase high fiber foods, meal planning and cooking strategies, better snacking choices, emotional eating strategies , avoiding temptations, planning for success, and keep a strict food journal. We discussed various medication options to help Julia Gomez with her weight loss efforts and we both agreed to continue metformin twice daily  for primary indication of prediabetes, start topiramate in the evening for cravings and continue to work on nutritional and behavioral strategies to promote weight loss.    Return in about 4 weeks (around 04/27/2023).Marland Kitchen She was informed of the importance of frequent follow up visits to maximize her success with intensive lifestyle modifications for her multiple health conditions.  Attestation Statements:   Reviewed by clinician on day of visit: allergies, medications, problem list, medical history, surgical history, family history, social history, and previous encounter notes.   Time spent on visit including pre-visit chart review and post-visit care and charting was 38 minutes  Marna Weniger,PA-C

## 2023-05-03 NOTE — Progress Notes (Unsigned)
 SUBJECTIVE: Discussed the use of AI scribe software for clinical note transcription with the patient, who gave verbal consent to proceed.  Chief Complaint: Obesity  Interim History: She is down 2 lbs since last visit.  Up 1 lb overall  Julia Gomez is here to discuss her progress with her obesity treatment plan. She is on the keeping a food journal and adhering to recommended goals of 1100-1200 calories and 85 protein and states she is following her eating plan approximately 60 % of the time. She states she is exercising walking for a lot of minutes 7 times per week. (Was on a cruise for 8 days).  Julia Gomez is a 73 year old female with obesity, prediabetes, and hyperlipidemia who presents for follow-up of her obesity treatment plan.  She has recently lost two pounds, attributed to increased physical activity during a recent cruise where she walked frequently. She manages her meals well during breakfast and lunch but tends to indulge during dinner.  She is currently taking metformin for her prediabetes but occasionally forgets the second dose, especially while on vacation. She is also on topiramate, which helps manage cravings without any side effects. Her sleep pattern remains unchanged with topiramate, and she generally sleeps well, although she wakes up due to bladder issues.  She maintains her protein intake with cottage cheese, yogurt, and a protein drink containing 40 grams of protein. She also includes cheese sticks and occasionally tuna sandwiches from [employer] in her diet. She is mindful of her calorie intake and is exploring more protein-rich snacks.  She plans to continue her physical activity by increasing her time on the bicycle, currently doing ten-minute sessions due to knee stiffness. She aims to gradually increase the duration and frequency of her cycling sessions.  OBJECTIVE: Visit Diagnoses: Problem List Items Addressed This Visit     Prediabetes - Primary   Relevant  Medications   metFORMIN (GLUCOPHAGE) 500 MG tablet   topiramate (TOPAMAX) 25 MG tablet   Other hyperlipidemia   Obesity with starting BMI of 36.4   Relevant Medications   metFORMIN (GLUCOPHAGE) 500 MG tablet   Other Visit Diagnoses       BMI 36.0-36.9,adult Current BMI 36.8          Obesity She has lost two pounds recently due to increased physical activity during a cruise. She is on a low dose of topiramate, which aids in managing cravings .  Emphasis is placed on maintaining and increasing physical activity, particularly cycling, to enhance weight management. - Continue topiramate at the current dose - Encourage increased physical activity, including cycling in 10-minute increments - [facility]  Prediabetes with cravings She has prediabetes and is on metformin. She occasionally forgets the second dose, especially when out, leading to gastrointestinal discomfort. Emphasis on medication adherence is crucial for managing glycemic levels. - Continue metformin as prescribed and topiramate as prescribed.  - [facility] ordered this encounter  Medications   metFORMIN (GLUCOPHAGE) 500 MG tablet    Sig: Take 1 tablet (500 mg total) by mouth 2 (two) times daily with a meal.    Dispense:  60 tablet    Refill:  0   topiramate (TOPAMAX) 25 MG tablet    Sig: Take 1 tablet (25 mg total) by mouth daily. Take nightly    Dispense:  30 tablet    Refill:  0    Hyperlipidemia On Lipitor 20 mg daily. No side effects.  Lab Results  Component Value Date   CHOL 162 05/28/2022   Lab Results  Component Value Date   HDL 57 05/28/2022   Lab Results  Component Value Date   LDLCALC 82 05/28/2022   Lab Results  Component Value Date   TRIG 129 05/28/2022   TRIG 112 09/12/2019   No results found for: "CHOLHDL" No results found for: "LDLDIRECT" Plan: Continue lipitor Continue to work on nutrition plan  -decreasing simple carbohydrates, increasing lean proteins, decreasing saturated fats and cholesterol , avoiding trans fats and exercise as able to promote weight loss, improve lipids and decrease cardiovascular risks.   General Health Maintenance She is encouraged to maintain a balanced diet with adequate protein intake. Suggestions for high-protein snacks and the importance of listening to her body when exercising are emphasized. Incorporating more walking and cycling is advised to support weight management and overall health. - Encourage a balanced diet with adequate protein intake - Suggest high-protein snacks such as [place] cheese and protein drinks - Encourage regular physical activity, including walking and cycling  Follow-up She is scheduled for a follow-up appointment in five weeks to monitor her progress with weight management and overall health. - Schedule follow-up appointment on Tuesday, Jun 08, 2023, at 2:30 PM Vitals Temp: 98.2 F (36.8 C) BP: (!) 156/90 Pulse Rate: 95 SpO2: 97 %   Anthropometric Measurements Height: 5\' 6"  (1.676 m) Weight: 227 lb (103 kg) BMI (Calculated): 36.66 Weight at Last Visit: 229 lb Weight Lost Since Last Visit: 2 lb Weight Gained Since Last Visit: 0 Starting Weight: 226 lb Total Weight Loss (lbs): 0 lb (0 kg) Peak Weight: 235 lb   Body Composition  Body Fat %: 49.7 % Fat Mass (lbs): 113.2 lbs Muscle Mass (lbs): 108.8 lbs Total Body Water (lbs): 89 lbs Visceral Fat Rating : 16   Other Clinical Data Fasting: no Labs: no Today's Visit #: 11 Starting Date: 05/28/22     ASSESSMENT AND PLAN:  Diet: Julia Gomez is currently in the action stage of change. As such, her goal is to continue with weight loss efforts. She has agreed to keeping a food journal and adhering to recommended goals of 1100-1200 calories and 85 grams of protein.  Exercise: Julia Gomez has been instructed to continue exercising as is and increase frequency of biking   for weight loss and overall health benefits.   Behavior Modification:  We discussed the following Behavioral Modification Strategies today: increasing lean protein intake, decreasing simple carbohydrates, increasing vegetables, increase H2O intake, increase high fiber foods, avoiding temptations, and planning for success. We discussed various medication options to help Julia Gomez with her weight loss efforts and we both agreed to continue to work on nutritional and behavioral strategies to promote weight loss.  .  Return in about 5 weeks (around 06/08/2023).Aaron Aas She was informed of the importance of frequent follow up visits to maximize her success with intensive lifestyle modifications for her multiple health conditions.  Attestation Statements:   Reviewed by clinician on day of visit: allergies, medications, problem list, medical history, surgical history, family history, social history, and previous encounter notes.   Time spent on visit including pre-visit chart review and post-visit care and charting was 27 minutes.    Julia Stagliano, PA-C

## 2023-05-04 ENCOUNTER — Ambulatory Visit (INDEPENDENT_AMBULATORY_CARE_PROVIDER_SITE_OTHER): Payer: PRIVATE HEALTH INSURANCE | Admitting: Physician Assistant

## 2023-05-04 ENCOUNTER — Encounter (INDEPENDENT_AMBULATORY_CARE_PROVIDER_SITE_OTHER): Payer: Self-pay | Admitting: Physician Assistant

## 2023-05-04 VITALS — BP 156/90 | HR 95 | Temp 98.2°F | Ht 66.0 in | Wt 227.0 lb

## 2023-05-04 DIAGNOSIS — R7303 Prediabetes: Secondary | ICD-10-CM | POA: Diagnosis not present

## 2023-05-04 DIAGNOSIS — E7849 Other hyperlipidemia: Secondary | ICD-10-CM | POA: Diagnosis not present

## 2023-05-04 DIAGNOSIS — E559 Vitamin D deficiency, unspecified: Secondary | ICD-10-CM

## 2023-05-04 DIAGNOSIS — Z6836 Body mass index (BMI) 36.0-36.9, adult: Secondary | ICD-10-CM | POA: Diagnosis not present

## 2023-05-04 DIAGNOSIS — E669 Obesity, unspecified: Secondary | ICD-10-CM | POA: Diagnosis not present

## 2023-05-04 MED ORDER — METFORMIN HCL 500 MG PO TABS: 500.0000 mg | ORAL_TABLET | Freq: Two times a day (BID) | ORAL | 0 refills | Status: DC

## 2023-05-04 MED ORDER — TOPIRAMATE 25 MG PO TABS: 25.0000 mg | ORAL_TABLET | Freq: Every day | ORAL | 0 refills | Status: DC

## 2023-06-01 ENCOUNTER — Other Ambulatory Visit (INDEPENDENT_AMBULATORY_CARE_PROVIDER_SITE_OTHER): Payer: Self-pay | Admitting: Physician Assistant

## 2023-06-01 DIAGNOSIS — R7303 Prediabetes: Secondary | ICD-10-CM

## 2023-06-08 ENCOUNTER — Encounter (INDEPENDENT_AMBULATORY_CARE_PROVIDER_SITE_OTHER): Payer: Self-pay | Admitting: Physician Assistant

## 2023-06-08 ENCOUNTER — Ambulatory Visit (INDEPENDENT_AMBULATORY_CARE_PROVIDER_SITE_OTHER): Payer: PRIVATE HEALTH INSURANCE | Admitting: Physician Assistant

## 2023-06-08 VITALS — BP 138/81 | HR 75 | Temp 98.0°F | Ht 66.0 in | Wt 226.0 lb

## 2023-06-08 DIAGNOSIS — Z6836 Body mass index (BMI) 36.0-36.9, adult: Secondary | ICD-10-CM

## 2023-06-08 DIAGNOSIS — R7303 Prediabetes: Secondary | ICD-10-CM

## 2023-06-08 DIAGNOSIS — E559 Vitamin D deficiency, unspecified: Secondary | ICD-10-CM | POA: Diagnosis not present

## 2023-06-08 DIAGNOSIS — E669 Obesity, unspecified: Secondary | ICD-10-CM

## 2023-06-08 MED ORDER — METFORMIN HCL 500 MG PO TABS: 500.0000 mg | ORAL_TABLET | Freq: Two times a day (BID) | ORAL | 0 refills | Status: DC

## 2023-06-08 MED ORDER — TOPIRAMATE 25 MG PO TABS: 50.0000 mg | ORAL_TABLET | Freq: Every day | ORAL | 0 refills | Status: DC

## 2023-06-08 MED ORDER — TOPIRAMATE 25 MG PO TABS: 50.0000 mg | ORAL_TABLET | Freq: Every day | ORAL | 1 refills | Status: AC

## 2023-06-08 MED ORDER — METFORMIN HCL 500 MG PO TABS: 500.0000 mg | ORAL_TABLET | Freq: Two times a day (BID) | ORAL | 1 refills | Status: AC

## 2023-06-08 NOTE — Progress Notes (Signed)
 SUBJECTIVE: Discussed the use of AI scribe software for clinical note transcription with the patient, who gave verbal consent to proceed.  Chief Complaint: Obesity  Interim History: She is down 1 lb since her last visit.    Julia Gomez is here to discuss her progress with her obesity treatment plan. She is on the keeping a food journal and adhering to recommended goals of 1100-1200 calories and 85 grams of protein and states she is following her eating plan approximately 80 % of the time. She states she is exercising cardio drumming/arthritic stretching 30-60 minutes 2 times per week. Julia Gomez is a 73 year old female who presents for follow-up of her obesity treatment plan.  She is adhering to a journaling plan with a daily intake of 1100 to 1200 calories and 85 grams of protein, with 80% compliance. She has lost one pound since her last visit. She engages in physical activity, including cardio drumming and arthritic stretching for 30 to 60 minutes twice a week.  Her current medications include metformin , taken twice daily, and Topamax , which she takes at bedtime. She is considering increasing the Topamax  dosage due to its potential benefits for cravings. She also takes cimetidine (Tagamet) for hives.  She engages in hobbies such as crocheting and cross-stitching, which have caused some thumb discomfort.  OBJECTIVE: Visit Diagnoses: Problem List Items Addressed This Visit     Prediabetes - Primary   Relevant Medications   metFORMIN  (GLUCOPHAGE ) 500 MG tablet   topiramate  (TOPAMAX ) 25 MG tablet   Vitamin D  deficiency   Obesity with starting BMI of 36.4   Relevant Medications   metFORMIN  (GLUCOPHAGE ) 500 MG tablet  Obesity Obesity management is ongoing with a calorie-restricted diet of 1100-1200 calories daily, including 85 grams of protein, adhered to 80% of the time. She has lost one pound since the last visit and engages in regular exercise, including cardio drumming and arthritic  stretching twice a week. Body composition analysis shows an increase in muscle mass and a decrease in fat mass by almost three pounds. Blood pressure is improved at 138/81 mmHg with a heart rate of 75 bpm. - Continue current diet and exercise regimen. - Increase topiramate  to 50 mg daily, taken as two 25 mg tablets at bedtime. Monitor for side effects such as fatigue and cognitive issues. Discontinue or reduce dose if adverse effects occur.  Prediabetes with cravings.  Prediabetes with cravings is well-managed with metformin , which is well-tolerated without gastrointestinal side effects. Taking topiramate  25 mg at bedtime for cravings, but plans to increase to 50 mg nightly and will monitor for SE like fatigue.  - Continue metformin  500 mg twice daily. Increase topiramate  to 50 mg nightly and continue if tolerates well.  - [facility]. Meds ordered this encounter  Medications   DISCONTD: topiramate  (TOPAMAX ) 25 MG tablet    Sig: Take 2 tablets (50 mg total) by mouth daily. Take nightly    Dispense:  60 tablet    Refill:  0   DISCONTD: metFORMIN  (GLUCOPHAGE ) 500 MG tablet    Sig: Take 1 tablet (500 mg total) by mouth 2 (two) times daily with a meal.    Dispense:  60 tablet    Refill:  0   metFORMIN  (GLUCOPHAGE ) 500 MG tablet    Sig: Take 1 tablet (500 mg total) by mouth 2 (two) times daily with a meal.    Dispense:  60 tablet    Refill:  1   topiramate  (  TOPAMAX ) 25 MG tablet    Sig: Take 2 tablets (50 mg total) by mouth daily. Take nightly    Dispense:  60 tablet    Refill:  1   Vitamin D  Deficiency Vitamin D  is at goal of 50.  Most recent vitamin D  level was 69.1. She is on OTC vitamin D3 5000 IU daily. Lab Results  Component Value Date   VD25OH 69.1 05/28/2022    Plan: Continue OTC vitamin D3 5000 IU daily Low vitamin D  levels can be associated with adiposity and may result in leptin resistance and weight gain. Also associated with fatigue.   Currently on vitamin D  supplementation without any adverse effects such as nausea, vomiting or muscle weakness.    Vitals Temp: 98 F (36.7 C) BP: 138/81 Pulse Rate: 75 SpO2: 96 %   Anthropometric Measurements Height: 5\' 6"  (1.676 m) Weight: 226 lb (102.5 kg) BMI (Calculated): 36.49 Weight at Last Visit: 227 lb Weight Lost Since Last Visit: 1 lb Weight Gained Since Last Visit: 0 Starting Weight: 226 lb Total Weight Loss (lbs): 0 lb (0 kg) Peak Weight: 235 lb   Body Composition  Body Fat %: 48.8 % Fat Mass (lbs): 110.4 lbs Muscle Mass (lbs): 110 lbs Total Body Water (lbs): 87.6 lbs Visceral Fat Rating : 16   Other Clinical Data Fasting: No Labs: No Today's Visit #: 12 Starting Date: 05/28/22     ASSESSMENT AND PLAN:  Diet: Julia Gomez is currently in the action stage of change. As such, her goal is to continue with weight loss efforts. She has agreed to keeping a food journal and adhering to recommended goals of 1100-1200 calories and 85 grams of protein.  Exercise: Julia Gomez has been instructed to continue exercising as is for weight loss and overall health benefits.   Behavior Modification:  We discussed the following Behavioral Modification Strategies today: increasing lean protein intake, decreasing simple carbohydrates, increasing vegetables, increase H2O intake, increase high fiber foods, meal planning and cooking strategies, avoiding temptations, planning for success, and keep a strict food journal. We discussed various medication options to help Julia Gomez with her weight loss efforts and we both agreed to increase topamax  to 50 mg nightly and continue all other current treatments, continue to work on nutritional and behavioral strategies to promote weight loss.  .  Return in about 6 weeks (around 07/20/2023).Julia Gomez She was informed of the importance of frequent follow up visits to maximize her success with intensive lifestyle modifications for her multiple health  conditions.  Attestation Statements:   Reviewed by clinician on day of visit: allergies, medications, problem list, medical history, surgical history, family history, social history, and previous encounter notes.   Time spent on visit including pre-visit chart review and post-visit care and charting was 27 minutes.    Julia Leifheit, PA-C

## 2023-07-26 NOTE — Progress Notes (Deleted)
   SUBJECTIVE: Discussed the use of AI scribe software for clinical note transcription with the patient, who gave verbal consent to proceed.  Chief Complaint: Obesity  Interim History: ***  Julia Gomez is here to discuss her progress with her obesity treatment plan. She is on the {HWW Weight Loss Plan:210964005} and states she {CHL AMB IS/IS NOT:210130109} following her eating plan approximately *** % of the time. She states she {CHL AMB IS/IS NOT:210130109} exercising *** minutes *** times per week.  Pharmacotherapy: Metformin  ***- primary indication of prediabetes          Topiramate  25 mg - primary indication of cravings with prediabetes   Cystoscopy with intravesical botox 05/17/23- [facility]  HAD AWV with PCP- Dr. Gladystine 07/13/23 with LABS: A1C 5.9 TSH 0.661~ stable/ T4 0.77- decreased from previous CMET- abnormals- Alk Phos 170- ~ stable           Ca++- 8.5- slightly decreased            GLucose 60            GFR- improved 72- previously 52-59             UA- + nitrate, trace protein OBJECTIVE: Visit Diagnoses: Problem List Items Addressed This Visit     Prediabetes - Primary   Other hyperlipidemia   Vitamin D  deficiency   Obesity with starting BMI of 36.4   Other Visit Diagnoses       OAB (overactive bladder)         Stage 3a chronic kidney disease (HCC)           No data recorded No data recorded No data recorded No data recorded   ASSESSMENT AND PLAN:  Diet: Julia Gomez {CHL AMB IS/IS NOT:210130109} currently in the action stage of change. As such, her goal is to {HWW Weight Loss Efforts:210964006}. She {HAS HAS WNU:81165} agreed to {HWW Weight Loss Plan:210964005}.  Exercise: Julia Gomez has been instructed {HWW Exercise:210964007} for weight loss and overall health benefits.   Behavior Modification:  We discussed the following Behavioral Modification Strategies today: {HWW Behavior Modification:210964008}. We discussed various medication options to help  Julia Gomez with her weight loss efforts and we both agreed to ***.  No follow-ups on file.Julia Gomez She was informed of the importance of frequent follow up visits to maximize her success with intensive lifestyle modifications for her multiple health conditions.  Attestation Statements:   Reviewed by clinician on day of visit: allergies, medications, problem list, medical history, surgical history, family history, social history, and previous encounter notes.   Time spent on visit including pre-visit chart review and post-visit care and charting was *** minutes.    Julia Koo, PA-C

## 2023-07-27 ENCOUNTER — Ambulatory Visit (INDEPENDENT_AMBULATORY_CARE_PROVIDER_SITE_OTHER): Payer: PRIVATE HEALTH INSURANCE | Admitting: Physician Assistant

## 2023-09-07 ENCOUNTER — Other Ambulatory Visit (INDEPENDENT_AMBULATORY_CARE_PROVIDER_SITE_OTHER): Payer: Self-pay | Admitting: Physician Assistant

## 2023-09-07 DIAGNOSIS — R7303 Prediabetes: Secondary | ICD-10-CM

## 2023-09-14 ENCOUNTER — Ambulatory Visit (INDEPENDENT_AMBULATORY_CARE_PROVIDER_SITE_OTHER): Payer: PRIVATE HEALTH INSURANCE | Admitting: Physician Assistant

## 2023-09-23 ENCOUNTER — Emergency Department (HOSPITAL_COMMUNITY)
Admission: EM | Admit: 2023-09-23 | Discharge: 2023-09-23 | Discharge status: Against Medical Advice | Attending: Emergency Medicine | Admitting: Emergency Medicine

## 2023-09-23 ENCOUNTER — Emergency Department (HOSPITAL_COMMUNITY)

## 2023-09-23 ENCOUNTER — Encounter (HOSPITAL_COMMUNITY): Payer: Self-pay | Admitting: Emergency Medicine

## 2023-09-23 ENCOUNTER — Other Ambulatory Visit: Payer: Self-pay

## 2023-09-23 DIAGNOSIS — Z5321 Procedure and treatment not carried out due to patient leaving prior to being seen by health care provider: Secondary | ICD-10-CM | POA: Diagnosis not present

## 2023-09-23 DIAGNOSIS — R42 Dizziness and giddiness: Secondary | ICD-10-CM | POA: Insufficient documentation

## 2023-09-23 LAB — COMPREHENSIVE METABOLIC PANEL WITH GFR
ALT: 25 U/L (ref 0–44)
AST: 18 U/L (ref 15–41)
Albumin: 3.5 g/dL (ref 3.5–5.0)
Alkaline Phosphatase: 122 U/L (ref 38–126)
Anion gap: 12 (ref 5–15)
BUN: 23 mg/dL (ref 8–23)
CO2: 23 mmol/L (ref 22–32)
Calcium: 8.4 mg/dL — ABNORMAL LOW (ref 8.9–10.3)
Chloride: 106 mmol/L (ref 98–111)
Creatinine, Ser: 1.15 mg/dL — ABNORMAL HIGH (ref 0.44–1.00)
GFR, Estimated: 51 mL/min — ABNORMAL LOW (ref >60)
Glucose, Bld: 117 mg/dL — ABNORMAL HIGH (ref 70–99)
Potassium: 4.6 mmol/L (ref 3.5–5.1)
Sodium: 141 mmol/L (ref 135–145)
Total Bilirubin: 0.4 mg/dL (ref 0.0–1.2)
Total Protein: 6.8 g/dL (ref 6.5–8.1)

## 2023-09-23 LAB — CBG MONITORING, ED: Glucose-Capillary: 110 mg/dL — ABNORMAL HIGH (ref 70–99)

## 2023-09-23 LAB — I-STAT CHEM 8, ED
BUN: 28 mg/dL — ABNORMAL HIGH (ref 8–23)
Calcium, Ion: 1.13 mmol/L — ABNORMAL LOW (ref 1.15–1.40)
Chloride: 106 mmol/L (ref 98–111)
Creatinine, Ser: 1.1 mg/dL — ABNORMAL HIGH (ref 0.44–1.00)
Glucose, Bld: 118 mg/dL — ABNORMAL HIGH (ref 70–99)
HCT: 41 % (ref 36.0–46.0)
Hemoglobin: 13.9 g/dL (ref 12.0–15.0)
Potassium: 4.7 mmol/L (ref 3.5–5.1)
Sodium: 139 mmol/L (ref 135–145)
TCO2: 27 mmol/L (ref 22–32)

## 2023-09-23 LAB — CBC
HCT: 41.3 % (ref 36.0–46.0)
Hemoglobin: 12.7 g/dL (ref 12.0–15.0)
MCH: 27.4 pg (ref 26.0–34.0)
MCHC: 30.8 g/dL (ref 30.0–36.0)
MCV: 89 fL (ref 80.0–100.0)
Platelets: 308 K/uL (ref 150–400)
RBC: 4.64 MIL/uL (ref 3.87–5.11)
RDW: 16 % — ABNORMAL HIGH (ref 11.5–15.5)
WBC: 7.7 K/uL (ref 4.0–10.5)
nRBC: 0 % (ref 0.0–0.2)

## 2023-09-23 NOTE — ED Provider Triage Note (Signed)
 Emergency Medicine Provider Triage Evaluation Note  Julia Gomez , a 73 y.o. female  was evaluated in triage.  Pt complains of dizziness that occurred while she was at the [employer].  Has subsided since then.  No chest pain, shortness of breath.  No associated headache.  Has not occurred prior to today.  Endorses less than ideal hydration  Review of Systems  Positive: As above Negative: As above  Physical Exam  BP 118/72   Pulse 74   Temp 97.8 F (36.6 C)   Resp 17   SpO2 97%  Gen:   Awake, no distress   Resp:  Normal effort  MSK:   Moves extremities without difficulty  Other:    Medical Decision Making  Medically screening exam initiated at 1:07 PM.  Appropriate orders placed.  Sheva Mcdougle was informed that the remainder of the evaluation will be completed by another provider, this initial triage assessment does not replace that evaluation, and the importance of remaining in the ED until their evaluation is complete.     Hildegard Loge, PA-C 09/23/23 1308

## 2023-09-23 NOTE — ED Triage Notes (Addendum)
 Pt [facility] from the [employer] [facility].  Had been sitting on a stool for about an hr, began feeling dizzy. No LOC, no fall. NO pain or other symptoms. Pt is asymptomatic at this time  128/60 HR 72 98% RA [id number]

## 2023-11-04 ENCOUNTER — Other Ambulatory Visit (INDEPENDENT_AMBULATORY_CARE_PROVIDER_SITE_OTHER): Payer: Self-pay | Admitting: Physician Assistant

## 2023-11-04 DIAGNOSIS — R7303 Prediabetes: Secondary | ICD-10-CM

## 2023-11-07 ENCOUNTER — Other Ambulatory Visit (INDEPENDENT_AMBULATORY_CARE_PROVIDER_SITE_OTHER): Payer: Self-pay | Admitting: Physician Assistant

## 2023-11-07 DIAGNOSIS — R7303 Prediabetes: Secondary | ICD-10-CM
